# Patient Record
Sex: Male | Born: 1965 | Race: White | Hispanic: No | Marital: Single | Smoking: Former smoker
Health system: Southern US, Community
[De-identification: ages and names within clinical notes are randomized; demographics above are authoritative.]

## PROBLEM LIST (undated history)

## (undated) DIAGNOSIS — T7840XA Allergy, unspecified, initial encounter: Secondary | ICD-10-CM

## (undated) DIAGNOSIS — M4800 Spinal stenosis, site unspecified: Secondary | ICD-10-CM

## (undated) DIAGNOSIS — H269 Unspecified cataract: Secondary | ICD-10-CM

## (undated) DIAGNOSIS — M5136 Other intervertebral disc degeneration, lumbar region: Secondary | ICD-10-CM

## (undated) DIAGNOSIS — H409 Unspecified glaucoma: Secondary | ICD-10-CM

## (undated) DIAGNOSIS — F419 Anxiety disorder, unspecified: Secondary | ICD-10-CM

## (undated) HISTORY — PX: KNEE SURGERY: SHX244

## (undated) HISTORY — DX: Unspecified glaucoma: H40.9

## (undated) HISTORY — DX: Allergy, unspecified, initial encounter: T78.40XA

## (undated) HISTORY — DX: Other intervertebral disc degeneration, lumbar region: M51.36

## (undated) HISTORY — PX: EYE SURGERY: SHX253

## (undated) HISTORY — PX: SPINAL FUSION: SHX223

## (undated) HISTORY — DX: Unspecified cataract: H26.9

## (undated) HISTORY — DX: Anxiety disorder, unspecified: F41.9

## (undated) HISTORY — DX: Spinal stenosis, site unspecified: M48.00

---

## 2016-05-30 ENCOUNTER — Encounter: Payer: Self-pay | Admitting: Emergency Medicine

## 2016-05-30 ENCOUNTER — Ambulatory Visit (INDEPENDENT_AMBULATORY_CARE_PROVIDER_SITE_OTHER): Payer: Medicare HMO | Admitting: Emergency Medicine

## 2016-05-30 VITALS — BP 131/89 | HR 83 | Temp 97.6°F | Resp 18 | Ht 75.0 in | Wt 229.0 lb

## 2016-05-30 DIAGNOSIS — H401113 Primary open-angle glaucoma, right eye, severe stage: Secondary | ICD-10-CM | POA: Diagnosis not present

## 2016-05-30 DIAGNOSIS — M5136 Other intervertebral disc degeneration, lumbar region: Secondary | ICD-10-CM | POA: Diagnosis not present

## 2016-05-30 DIAGNOSIS — Z0001 Encounter for general adult medical examination with abnormal findings: Secondary | ICD-10-CM | POA: Diagnosis not present

## 2016-05-30 NOTE — Patient Instructions (Addendum)
IF you received an x-ray today, you will receive an invoice from [facility]. Please contact [facility] at [phone] with questions or concerns regarding your invoice.   IF you received labwork today, you will receive an invoice from [facility]. Please contact [facility] at [phone] with questions or concerns regarding your invoice.   Our billing staff will not be able to assist you with questions regarding bills from these companies.  You will be contacted with the lab results as soon as they are available. The fastest way to get your results is to activate your My Chart account. Instructions are located on the last page of this paperwork. If you have not Lamb from [place] regarding the results in 2 weeks, please contact this office.         Health Maintenance, Male A healthy lifestyle and preventive care is important for your health and wellness. Ask your health care provider about what schedule of regular examinations is right for you. What should I know about weight and diet?  Eat a Healthy Diet  Eat plenty of vegetables, fruits, whole grains, low-fat dairy products, and lean protein.  Do not eat a lot of foods high in solid fats, added sugars, or salt. Maintain a Healthy Weight  Regular exercise can help you achieve or maintain a healthy weight. You should:  Do at least 150 minutes of exercise each week. The exercise should increase your heart rate and make you sweat (moderate-intensity exercise).  Do strength-training exercises at least twice a week. Watch Your Levels of Cholesterol and Blood Lipids  Have your blood tested for lipids and cholesterol every 5 years starting at 53 years of age. If you are at high risk for heart disease, you should start having your blood tested when you are 53 years old. You may need to have your cholesterol levels checked more often if:  Your lipid or cholesterol levels are high.  You are older than 53 years of  age.  You are at high risk for heart disease. What should I know about cancer screening? Many types of cancers can be detected early and may often be prevented. Lung Cancer  You should be screened every year for lung cancer if:  You are a current smoker who has smoked for at least 30 years.  You are a former smoker who has quit within the past 15 years.  Talk to your health care provider about your screening options, when you should start screening, and how often you should be screened. Colorectal Cancer  Routine colorectal cancer screening usually begins at 53 years of age and should be repeated every 5-10 years until you are 53 years old. You may need to be screened more often if early forms of precancerous polyps or small growths are found. Your health care provider may recommend screening at an earlier age if you have risk factors for colon cancer.  Your health care provider may recommend using home test kits to check for hidden blood in the stool.  A small camera at the end of a tube can be used to examine your colon (sigmoidoscopy or colonoscopy). This checks for the earliest forms of colorectal cancer. Prostate and Testicular Cancer  Depending on your age and overall health, your health care provider may do certain tests to screen for prostate and testicular cancer.  Talk to your health care provider about any symptoms or concerns you have about testicular or prostate cancer. Skin Cancer  Check your skin from  head to toe regularly.  Tell your health care provider about any new moles or changes in moles, especially if:  There is a change in a mole's size, shape, or color.  You have a mole that is larger than a pencil eraser.  Always use sunscreen. Apply sunscreen liberally and repeat throughout the day.  Protect yourself by wearing long sleeves, pants, a wide-brimmed hat, and sunglasses when outside. What should I know about heart disease, diabetes, and high blood  pressure?  If you are 41-9 years of age, have your blood pressure checked every 3-5 years. If you are 50 years of age or older, have your blood pressure checked every year. You should have your blood pressure measured twice-once when you are at a hospital or clinic, and once when you are not at a hospital or clinic. Record the average of the two measurements. To check your blood pressure when you are not at a hospital or clinic, you can use:  An automated blood pressure machine at a pharmacy.  A home blood pressure monitor.  Talk to your health care provider about your target blood pressure.  If you are between 78-70 years old, ask your health care provider if you should take aspirin to prevent heart disease.  Have regular diabetes screenings by checking your fasting blood sugar level.  If you are at a normal weight and have a low risk for diabetes, have this test once every three years after the age of 3.  If you are overweight and have a high risk for diabetes, consider being tested at a younger age or more often.  A one-time screening for abdominal aortic aneurysm (AAA) by ultrasound is recommended for men aged 22-75 years who are current or former smokers. What should I know about preventing infection? Hepatitis B  If you have a higher risk for hepatitis B, you should be screened for this virus. Talk with your health care provider to find out if you are at risk for hepatitis B infection. Hepatitis C  Blood testing is recommended for:  Everyone born from [birth year] through 1965.  Anyone with known risk factors for hepatitis C. Sexually Transmitted Diseases (STDs)  You should be screened each year for STDs including gonorrhea and chlamydia if:  You are sexually active and are younger than 53 years of age.  You are older than 53 years of age and your health care provider tells you that you are at risk for this type of infection.  Your sexual activity has changed since you were last  screened and you are at an increased risk for chlamydia or gonorrhea. Ask your health care provider if you are at risk.  Talk with your health care provider about whether you are at high risk of being infected with HIV. Your health care provider may recommend a prescription medicine to help prevent HIV infection. What else can I do?  Schedule regular health, dental, and eye exams.  Stay current with your vaccines (immunizations).  Do not use any tobacco products, such as cigarettes, chewing tobacco, and e-cigarettes. If you need help quitting, ask your health care provider.  Limit alcohol intake to no more than 2 drinks per day. One drink equals 12 ounces of beer, 5 ounces of wine, or 1 ounces of hard liquor.  Do not use street drugs.  Do not share needles.  Ask your health care provider for help if you need support or information about quitting drugs.  Tell your health care provider if  you often feel depressed.  Tell your health care provider if you have ever been abused or do not feel safe at home. This information is not intended to replace advice given to you by your health care provider. Make sure you discuss any questions you have with your health care provider. Document Released: 07/12/2007 Document Revised: 09/12/2015 Document Reviewed: 10/17/2014 Elsevier Interactive Patient Education  2017 Elsevier Inc.  Back Pain, Adult Back pain is very common. The pain often gets better over time. The cause of back pain is usually not dangerous. Most people can learn to manage their back pain on their own. Follow these instructions at home: Watch your back pain for any changes. The following actions may help to lessen any pain you are feeling:  Stay active. Start with short walks on flat ground if you can. Try to walk farther each day.  Exercise regularly as told by your doctor. Exercise helps your back heal faster. It also helps avoid future injury by keeping your muscles strong and  flexible.  Do not sit, drive, or stand in one place for more than 30 minutes.  Do not stay in bed. Resting more than 1-2 days can slow down your recovery.  Be careful when you bend or lift an object. Use good form when lifting:  Bend at your knees.  Keep the object close to your body.  Do not twist.  Sleep on a firm mattress. Lie on your side, and bend your knees. If you lie on your back, put a pillow under your knees.  Take medicines only as told by your doctor.  Put ice on the injured area.  Put ice in a plastic bag.  Place a towel between your skin and the bag.  Leave the ice on for 20 minutes, 2-3 times a day for the first 2-3 days. After that, you can switch between ice and heat packs.  Avoid feeling anxious or stressed. Find good ways to deal with stress, such as exercise.  Maintain a healthy weight. Extra weight puts stress on your back. Contact a doctor if:  You have pain that does not go away with rest or medicine.  You have worsening pain that goes down into your legs or buttocks.  You have pain that does not get better in one week.  You have pain at night.  You lose weight.  You have a fever or chills. Get help right away if:  You cannot control when you poop (bowel movement) or pee (urinate).  Your arms or legs feel weak.  Your arms or legs lose feeling (numbness).  You feel sick to your stomach (nauseous) or throw up (vomit).  You have belly (abdominal) pain.  You feel like you may pass out (faint). This information is not intended to replace advice given to you by your health care provider. Make sure you discuss any questions you have with your health care provider. Document Released: 07/02/2007 Document Revised: 06/21/2015 Document Reviewed: 05/17/2013 Elsevier Interactive Patient Education  2017 [employer]

## 2016-05-30 NOTE — Progress Notes (Signed)
Matthew Weaver 51 y.o.   Chief Complaint  Patient presents with  . Establish Care    HISTORY OF PRESENT ILLNESS:  This is a 51 y.o. male recently moved from [place] and needs to establish care in this rea; main problem is chronic lumbar pain secondary to severe DDD of lumbar spine; s/p surgery that wasn't very successful; has chronic low back pain; also has chronic glaucoma right eye with about 20% vision left. Brought copies of medical records; reviewed.  HPI   Prior to Admission medications   Medication Sig Start Date End Date Taking? Authorizing Provider  brimonidine (ALPHAGAN P) 0.1 % SOLN    Yes Historical Provider, MD  citalopram (CELEXA) 20 MG tablet Take 20 mg by mouth daily.   Yes Historical Provider, MD  diclofenac (VOLTAREN) 50 MG EC tablet Take 50 mg by mouth 3 (three) times daily. As needed   Yes Historical Provider, MD  latanoprost (XALATAN) 0.005 % ophthalmic solution 1 drop at bedtime.   Yes Historical Provider, MD  timolol (BETIMOL) 0.5 % ophthalmic solution 1 drop 2 (two) times daily.   Yes Historical Provider, MD  tizanidine (ZANAFLEX) 2 MG capsule Take 2 mg by mouth 3 (three) times daily as needed for muscle spasms.   Yes Historical Provider, MD    No Known Allergies  There are no active problems to display for this patient.   Past Medical History:  Diagnosis Date  . Allergy   . Anxiety   . Cataract   . DDD (degenerative disc disease), lumbar   . Glaucoma   . Spinal stenosis     Past Surgical History:  Procedure Laterality Date  . EYE SURGERY    . KNEE SURGERY    . SPINAL FUSION      Social History   Social History  . Marital status: Single    Spouse name: N/A  . Number of children: N/A  . Years of education: N/A   Occupational History  . Not on file.   Social History Main Topics  . Smoking status: Former Smoker    Quit date: 05/30/1996  . Smokeless tobacco: Never Used  . Alcohol use No  . Drug use: No  . Sexual activity: Not on file    Other Topics Concern  . Not on file   Social History Narrative  . No narrative on file    Family History  Problem Relation Age of Onset  . Cancer Mother   . Diabetes Father   . Hyperlipidemia Father   . Hypertension Father   . Stroke Father      Review of Systems  Constitutional: Negative.  Negative for chills, fever and weight loss.  HENT: Negative.  Negative for ear pain, hearing loss and sore throat.   Eyes: Positive for blurred vision (right eye). Negative for pain.  Respiratory: Negative.  Negative for cough and shortness of breath.   Cardiovascular: Negative.  Negative for chest pain, palpitations and leg swelling.  Gastrointestinal: Negative for abdominal pain, diarrhea, nausea and vomiting.  Genitourinary: Negative for dysuria and hematuria.  Musculoskeletal: Positive for back pain. Negative for neck pain.  Skin: Negative.  Negative for rash.  Neurological: Negative.  Negative for dizziness, sensory change, speech change, focal weakness and headaches.  Endo/Heme/Allergies: Negative.   All other systems reviewed and are negative.  Vitals:   05/30/16 0952  BP: 131/89  Pulse: 83  Resp: 18  Temp: 97.6 F (36.4 C)     Physical Exam  Constitutional: He  is oriented to person, place, and time. He appears well-developed and well-nourished.  HENT:  Head: Normocephalic and atraumatic.  Nose: Nose normal.  Mouth/Throat: Oropharynx is clear and moist.  Eyes: Conjunctivae and EOM are normal.  Right pupil bigger than left and poorly reactive with irregular shape  Neck: Normal range of motion. Neck supple. No JVD present.  Cardiovascular: Normal rate, regular rhythm and normal heart sounds.   Pulmonary/Chest: Effort normal and breath sounds normal.  Abdominal: Soft. Bowel sounds are normal. He exhibits no distension. There is no tenderness.  Musculoskeletal:       Lumbar back: He exhibits decreased range of motion, tenderness and pain. He exhibits no bony tenderness,  no swelling and normal pulse.  Lymphadenopathy:    He has no cervical adenopathy.  Neurological: He is alert and oriented to person, place, and time. He displays normal reflexes. No sensory deficit. He exhibits normal muscle tone.  Skin: Skin is warm and dry. Capillary refill takes less than 2 seconds.  Psychiatric: He has a normal mood and affect.  Vitals reviewed.    ASSESSMENT & PLAN: Matthew Weaver was seen today for establish care.  Diagnoses and all orders for this visit:  Encounter for general adult medical examination with abnormal findings -     CBC with Differential -     Comprehensive metabolic panel -     Hemoglobin A1c -     Lipid panel -     Hepatitis C antibody screen -     HIV antibody -     Ambulatory referral to Orthopedic Surgery -     Ambulatory referral to Pain Clinic  DDD (degenerative disc disease), lumbar  Primary open angle glaucoma (POAG) of right eye, severe stage -     Ambulatory referral to Ophthalmology    Patient Instructions       IF you received an x-ray today, you will receive an invoice from [facility]. Please contact [facility] at [phone] with questions or concerns regarding your invoice.   IF you received labwork today, you will receive an invoice from [facility]. Please contact [facility] at [phone] with questions or concerns regarding your invoice.   Our billing staff will not be able to assist you with questions regarding bills from these companies.  You will be contacted with the lab results as soon as they are available. The fastest way to get your results is to activate your My Chart account. Instructions are located on the last page of this paperwork. If you have not heard from [place] regarding the results in 2 weeks, please contact this office.         Health Maintenance, Male A healthy lifestyle and preventive care is important for your health and wellness. Ask your health care provider about what schedule  of regular examinations is right for you. What should I know about weight and diet?  Eat a Healthy Diet  Eat plenty of vegetables, fruits, whole grains, low-fat dairy products, and lean protein.  Do not eat a lot of foods high in solid fats, added sugars, or salt. Maintain a Healthy Weight  Regular exercise can help you achieve or maintain a healthy weight. You should:  Do at least 150 minutes of exercise each week. The exercise should increase your heart rate and make you sweat (moderate-intensity exercise).  Do strength-training exercises at least twice a week. Watch Your Levels of Cholesterol and Blood Lipids  Have your blood tested for lipids and cholesterol every 5 years starting  at 53 years of age. If you are at high risk for heart disease, you should start having your blood tested when you are 53 years old. You may need to have your cholesterol levels checked more often if:  Your lipid or cholesterol levels are high.  You are older than 53 years of age.  You are at high risk for heart disease. What should I know about cancer screening? Many types of cancers can be detected early and may often be prevented. Lung Cancer  You should be screened every year for lung cancer if:  You are a current smoker who has smoked for at least 30 years.  You are a former smoker who has quit within the past 15 years.  Talk to your health care provider about your screening options, when you should start screening, and how often you should be screened. Colorectal Cancer  Routine colorectal cancer screening usually begins at 53 years of age and should be repeated every 5-10 years until you are 53 years old. You may need to be screened more often if early forms of precancerous polyps or small growths are found. Your health care provider may recommend screening at an earlier age if you have risk factors for colon cancer.  Your health care provider may recommend using home test kits to check for  hidden blood in the stool.  A small camera at the end of a tube can be used to examine your colon (sigmoidoscopy or colonoscopy). This checks for the earliest forms of colorectal cancer. Prostate and Testicular Cancer  Depending on your age and overall health, your health care provider may do certain tests to screen for prostate and testicular cancer.  Talk to your health care provider about any symptoms or concerns you have about testicular or prostate cancer. Skin Cancer  Check your skin from head to toe regularly.  Tell your health care provider about any new moles or changes in moles, especially if:  There is a change in a mole's size, shape, or color.  You have a mole that is larger than a pencil eraser.  Always use sunscreen. Apply sunscreen liberally and repeat throughout the day.  Protect yourself by wearing long sleeves, pants, a wide-brimmed hat, and sunglasses when outside. What should I know about heart disease, diabetes, and high blood pressure?  If you are 33-78 years of age, have your blood pressure checked every 3-5 years. If you are 44 years of age or older, have your blood pressure checked every year. You should have your blood pressure measured twice-once when you are at a hospital or clinic, and once when you are not at a hospital or clinic. Record the average of the two measurements. To check your blood pressure when you are not at a hospital or clinic, you can use:  An automated blood pressure machine at a pharmacy.  A home blood pressure monitor.  Talk to your health care provider about your target blood pressure.  If you are between 66-62 years old, ask your health care provider if you should take aspirin to prevent heart disease.  Have regular diabetes screenings by checking your fasting blood sugar level.  If you are at a normal weight and have a low risk for diabetes, have this test once every three years after the age of 101.  If you are overweight and  have a high risk for diabetes, consider being tested at a younger age or more often.  A one-time screening for abdominal aortic  aneurysm (AAA) by ultrasound is recommended for men aged 65-75 years who are current or former smokers. What should I know about preventing infection? Hepatitis B  If you have a higher risk for hepatitis B, you should be screened for this virus. Talk with your health care provider to find out if you are at risk for hepatitis B infection. Hepatitis C  Blood testing is recommended for:  Everyone born from [birth year] through 1967.  Anyone with known risk factors for hepatitis C. Sexually Transmitted Diseases (STDs)  You should be screened each year for STDs including gonorrhea and chlamydia if:  You are sexually active and are younger than 53 years of age.  You are older than 53 years of age and your health care provider tells you that you are at risk for this type of infection.  Your sexual activity has changed since you were last screened and you are at an increased risk for chlamydia or gonorrhea. Ask your health care provider if you are at risk.  Talk with your health care provider about whether you are at high risk of being infected with HIV. Your health care provider may recommend a prescription medicine to help prevent HIV infection. What else can I do?  Schedule regular health, dental, and eye exams.  Stay current with your vaccines (immunizations).  Do not use any tobacco products, such as cigarettes, chewing tobacco, and e-cigarettes. If you need help quitting, ask your health care provider.  Limit alcohol intake to no more than 2 drinks per day. One drink equals 12 ounces of beer, 5 ounces of wine, or 1 ounces of hard liquor.  Do not use street drugs.  Do not share needles.  Ask your health care provider for help if you need support or information about quitting drugs.  Tell your health care provider if you often feel depressed.  Tell your health  care provider if you have ever been abused or do not feel safe at home. This information is not intended to replace advice given to you by your health care provider. Make sure you discuss any questions you have with your health care provider. Document Released: 07/12/2007 Document Revised: 09/12/2015 Document Reviewed: 10/17/2014 Elsevier Interactive Patient Education  2017 Elsevier Inc.  Back Pain, Adult Back pain is very common. The pain often gets better over time. The cause of back pain is usually not dangerous. Most people can learn to manage their back pain on their own. Follow these instructions at home: Watch your back pain for any changes. The following actions may help to lessen any pain you are feeling:  Stay active. Start with short walks on flat ground if you can. Try to walk farther each day.  Exercise regularly as told by your doctor. Exercise helps your back heal faster. It also helps avoid future injury by keeping your muscles strong and flexible.  Do not sit, drive, or stand in one place for more than 30 minutes.  Do not stay in bed. Resting more than 1-2 days can slow down your recovery.  Be careful when you bend or lift an object. Use good form when lifting:  Bend at your knees.  Keep the object close to your body.  Do not twist.  Sleep on a firm mattress. Lie on your side, and bend your knees. If you lie on your back, put a pillow under your knees.  Take medicines only as told by your doctor.  Put ice on the injured area.  Put ice  in a plastic bag.  Place a towel between your skin and the bag.  Leave the ice on for 20 minutes, 2-3 times a day for the first 2-3 days. After that, you can switch between ice and heat packs.  Avoid feeling anxious or stressed. Find good ways to deal with stress, such as exercise.  Maintain a healthy weight. Extra weight puts stress on your back. Contact a doctor if:  You have pain that does not go away with rest or  medicine.  You have worsening pain that goes down into your legs or buttocks.  You have pain that does not get better in one week.  You have pain at night.  You lose weight.  You have a fever or chills. Get help right away if:  You cannot control when you poop (bowel movement) or pee (urinate).  Your arms or legs feel weak.  Your arms or legs lose feeling (numbness).  You feel sick to your stomach (nauseous) or throw up (vomit).  You have belly (abdominal) pain.  You feel like you may pass out (faint). This information is not intended to replace advice given to you by your health care provider. Make sure you discuss any questions you have with your health care provider. Document Released: 07/02/2007 Document Revised: 06/21/2015 Document Reviewed: 05/17/2013 Elsevier Interactive Patient Education  2017 Elsevier Inc.      Edwina BarthMiguel Jett Kulzer, MD [facility]

## 2016-05-31 LAB — COMPREHENSIVE METABOLIC PANEL
ALK PHOS: 52 IU/L (ref 39–117)
ALT: 13 IU/L (ref 0–44)
AST: 19 IU/L (ref 0–40)
Albumin/Globulin Ratio: 1.8 (ref 1.2–2.2)
Albumin: 4.4 g/dL (ref 3.5–5.5)
BILIRUBIN TOTAL: 0.5 mg/dL (ref 0.0–1.2)
BUN/Creatinine Ratio: 15 (ref 9–20)
BUN: 15 mg/dL (ref 6–24)
CHLORIDE: 102 mmol/L (ref 96–106)
CO2: 22 mmol/L (ref 18–29)
Calcium: 9.3 mg/dL (ref 8.7–10.2)
Creatinine, Ser: 1.01 mg/dL (ref 0.76–1.27)
GFR calc Af Amer: 98 mL/min/{1.73_m2} (ref >59)
GFR calc non Af Amer: 85 mL/min/{1.73_m2} (ref >59)
GLUCOSE: 104 mg/dL — AB (ref 65–99)
Globulin, Total: 2.4 g/dL (ref 1.5–4.5)
Potassium: 4.4 mmol/L (ref 3.5–5.2)
Sodium: 140 mmol/L (ref 134–144)
TOTAL PROTEIN: 6.8 g/dL (ref 6.0–8.5)

## 2016-05-31 LAB — CBC WITH DIFFERENTIAL/PLATELET
Basophils Absolute: 0.1 10*3/uL (ref 0.0–0.2)
Basos: 1 %
EOS (ABSOLUTE): 0.2 10*3/uL (ref 0.0–0.4)
Eos: 4 %
Hematocrit: 43.6 % (ref 37.5–51.0)
Hemoglobin: 14.8 g/dL (ref 13.0–17.7)
IMMATURE GRANS (ABS): 0 10*3/uL (ref 0.0–0.1)
Immature Granulocytes: 0 %
LYMPHS: 29 %
Lymphocytes Absolute: 1.8 10*3/uL (ref 0.7–3.1)
MCH: 30.6 pg (ref 26.6–33.0)
MCHC: 33.9 g/dL (ref 31.5–35.7)
MCV: 90 fL (ref 79–97)
Monocytes Absolute: 0.7 10*3/uL (ref 0.1–0.9)
Monocytes: 11 %
NEUTROS ABS: 3.3 10*3/uL (ref 1.4–7.0)
Neutrophils: 55 %
PLATELETS: 276 10*3/uL (ref 150–379)
RBC: 4.83 x10E6/uL (ref 4.14–5.80)
RDW: 13.6 % (ref 12.3–15.4)
WBC: 6 10*3/uL (ref 3.4–10.8)

## 2016-05-31 LAB — HEMOGLOBIN A1C
ESTIMATED AVERAGE GLUCOSE: 108 mg/dL
HEMOGLOBIN A1C: 5.4 % (ref 4.8–5.6)

## 2016-05-31 LAB — LIPID PANEL
CHOLESTEROL TOTAL: 188 mg/dL (ref 100–199)
Chol/HDL Ratio: 3.6 ratio (ref 0.0–5.0)
HDL: 52 mg/dL (ref >39)
LDL Calculated: 120 mg/dL — ABNORMAL HIGH (ref 0–99)
TRIGLYCERIDES: 80 mg/dL (ref 0–149)
VLDL Cholesterol Cal: 16 mg/dL (ref 5–40)

## 2016-05-31 LAB — HEPATITIS C ANTIBODY: Hep C Virus Ab: <0.1 s/co ratio (ref 0.0–0.9)

## 2016-05-31 LAB — HIV ANTIBODY (ROUTINE TESTING W REFLEX): HIV Screen 4th Generation wRfx: NONREACTIVE

## 2016-06-11 DIAGNOSIS — H35371 Puckering of macula, right eye: Secondary | ICD-10-CM | POA: Diagnosis not present

## 2016-06-11 DIAGNOSIS — H25812 Combined forms of age-related cataract, left eye: Secondary | ICD-10-CM | POA: Diagnosis not present

## 2016-06-11 DIAGNOSIS — H21232 Degeneration of iris (pigmentary), left eye: Secondary | ICD-10-CM | POA: Diagnosis not present

## 2016-06-11 DIAGNOSIS — H401313 Pigmentary glaucoma, right eye, severe stage: Secondary | ICD-10-CM | POA: Diagnosis not present

## 2016-08-13 ENCOUNTER — Encounter (INDEPENDENT_AMBULATORY_CARE_PROVIDER_SITE_OTHER): Payer: Self-pay | Admitting: Specialist

## 2016-08-13 ENCOUNTER — Ambulatory Visit (INDEPENDENT_AMBULATORY_CARE_PROVIDER_SITE_OTHER): Payer: Medicare HMO

## 2016-08-13 ENCOUNTER — Ambulatory Visit (INDEPENDENT_AMBULATORY_CARE_PROVIDER_SITE_OTHER): Payer: Medicare HMO | Admitting: Specialist

## 2016-08-13 VITALS — BP 124/90 | HR 66 | Ht 75.0 in | Wt 229.0 lb

## 2016-08-13 DIAGNOSIS — M4726 Other spondylosis with radiculopathy, lumbar region: Secondary | ICD-10-CM

## 2016-08-13 DIAGNOSIS — M5442 Lumbago with sciatica, left side: Secondary | ICD-10-CM

## 2016-08-13 DIAGNOSIS — R29898 Other symptoms and signs involving the musculoskeletal system: Secondary | ICD-10-CM

## 2016-08-13 DIAGNOSIS — M5441 Lumbago with sciatica, right side: Secondary | ICD-10-CM

## 2016-08-13 DIAGNOSIS — M5136 Other intervertebral disc degeneration, lumbar region: Secondary | ICD-10-CM

## 2016-08-13 DIAGNOSIS — G8929 Other chronic pain: Secondary | ICD-10-CM | POA: Diagnosis not present

## 2016-08-13 DIAGNOSIS — S82142S Displaced bicondylar fracture of left tibia, sequela: Secondary | ICD-10-CM

## 2016-08-13 DIAGNOSIS — M1612 Unilateral primary osteoarthritis, left hip: Secondary | ICD-10-CM

## 2016-08-13 DIAGNOSIS — R200 Anesthesia of skin: Secondary | ICD-10-CM | POA: Diagnosis not present

## 2016-08-13 MED ORDER — DICLOFENAC SODIUM 50 MG PO TBEC: 50.0000 mg | DELAYED_RELEASE_TABLET | Freq: Three times a day (TID) | ORAL | 3 refills | Status: AC

## 2016-08-13 MED ORDER — TIZANIDINE HCL 2 MG PO CAPS: 2.0000 mg | ORAL_CAPSULE | Freq: Three times a day (TID) | ORAL | 2 refills | Status: AC | PRN

## 2016-08-13 NOTE — Progress Notes (Signed)
Office Visit Note   Patient: Matthew Weaver           Date of Birth: 10-09-1965           MRN: [id number] Visit Date: 08/13/2016              Requested by: Georgina QuintSagardia, Miguel Jose, MD [address] [place], [place] [address] PCP: Georgina QuintSagardia, Miguel Jose, MD   Assessment & Plan: Visit Diagnoses:  1. Chronic bilateral low back pain with bilateral sciatica   2. DDD (degenerative disc disease), lumbar   3. Bilateral leg weakness   4. Bilateral leg numbness   5. Other spondylosis with radiculopathy, lumbar region   6. Tibial plateau fracture, left, sequela   7. Unilateral primary osteoarthritis, left hip   51 year old male with a long history of lumbar conditions that have disabled him in the past and present. Difficulty with standing and walking and Sitting. Pain on a scale of 10 at "9" even during today's exam, but only taking intermittant tizanidine and diclofenac. Exam suggests tight hamstrings, limited ROM of the lumbar spine with weakness in foot dorsiflexion bilateral inspite of relatively normal or mildly hyper reflexes and symmetric thigh and calf circumfences suggesting no atrophy. Previous history of peripheral neuropathy with a history of previous [place] in [place]. He was Referred by Dr. Alvy BimlerSagardia for a orthopaedic evaluation, no recent MRI since 03/2014. He complains of increasing leg and foot weakness and this should be investigated with repeat EMG/NCV and Lumbar MRI. Increased reflexes and xray findings at the thoracolumbar junction may eventually require assessment of the thoracic spine, but no significant spasticity or clonus is present so that lumbar eval is appropriate at this time and referral to  Neurology to help us sort out the seemingly diffuse numbness and weakness and whether there is an underlying peripheral neuropathy.Base on his current loss of motion of the lumbar spine and lower extremity weakness I believe he is totally disabled and incapable of performing  gainful employment.  Plan: Avoid frequent bending and stooping  No lifting greater than 10 lbs. May use ice or moist heat for pain. Weight loss is of benefit. Handicap license is approved. MRI of the lumbar spine is ordered to be done with and without contrast Placed an order for a neurology eval with EMG/NCV to assess lower leg nerve findings.  Follow-Up Instructions: Return in about 5 weeks (around 09/17/2016), or if symptoms worsen or fail to improve.   Orders:  Orders Placed This Encounter  Procedures  . XR Lumbar Spine 2-3 Views  . MR Lumbar Spine W Wo Contrast  . Ambulatory referral to Neurology   Meds ordered this encounter  Medications  . tizanidine (ZANAFLEX) 2 MG capsule    Sig: Take 1 capsule (2 mg total) by mouth 3 (three) times daily as needed for muscle spasms.    Dispense:  30 capsule    Refill:  2  . diclofenac (VOLTAREN) 50 MG EC tablet    Sig: Take 1 tablet (50 mg total) by mouth 3 (three) times daily. As needed    Dispense:  90 tablet    Refill:  3      Procedures: No procedures performed   Clinical Data: No additional findings.   Subjective: Chief Complaint  Patient presents with  . Lower Back - Numbness, Pain    51 year old male with long history of low back pain. Work[place] in [employer] [place] in the town  of [place], [place] in [place]. He reportedly injured his back after working 10 years, he underwent lumbar fusion L3-4 1993. 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 He had injuryu to the left leg with a tibial plateau fracture. He has numbness in the buttocks the thighs and the the calfs. The right side with numbness in the toes All of the toes. The little toe is really numb. The numbness is becoming worse and it is becoming consistent. He has night numbness in the legs and it awakens him. He takes tizanide that he uses as needed for restlessness in his  legs with trying to sleep. He is able to walk just a short distance. He gets help with grocery shopping, he leans on the shopping cart.Sitting is also painful, he gets numbness in his bottom. His lower back has a squeezing pain. He gets aches and stiffness. Lying flat on his back the legs get numbness. He feels like the L5-S1 level is the cause of the numbness. Scale of 1-10 the pain in the back is about a "9" presently.  Bending and stooping increases the pain. Riding in a car the legs go numb after 15-20 minutes. Bowel and bladder is functioning normally.The pain is in the legs and the back. Coughing or sneezing do cause pain sometimes. Legs do feel weak at time. Dr. Jaci Standard, [facility] did EMGs/NCV and he also had these done in [place] in the past 5 years. Last MRI was in 03/2014     Review of Systems  Constitutional: Positive for activity change and unexpected weight change.  HENT: Positive for postnasal drip and sneezing.   Eyes: Negative.   Respiratory: Positive for chest tightness. Negative for shortness of breath and wheezing.   Cardiovascular: Negative.  Negative for leg swelling.  Gastrointestinal: Negative.  Negative for abdominal pain, diarrhea, nausea and rectal pain.  Endocrine: Negative.   Genitourinary: Negative.  Negative for dysuria, enuresis and flank pain.  Musculoskeletal: Positive for arthralgias, back pain and joint swelling. Negative for neck pain and neck stiffness.  Skin: Negative.  Negative for color change, pallor, rash and wound.  Allergic/Immunologic: Negative.  Negative for environmental allergies and immunocompromised state.  Neurological: Positive for light-headedness and numbness. Negative for dizziness, tremors, seizures, syncope, facial asymmetry, speech difficulty, weakness and headaches.  Hematological: Negative.  Negative for adenopathy. Does not bruise/bleed easily.  Psychiatric/Behavioral: Negative for agitation, behavioral problems, confusion, dysphoric  mood, self-injury, sleep disturbance and suicidal ideas. The patient is nervous/anxious. The patient is not hyperactive.      Objective: Vital Signs: BP 124/90 (BP Location: Left Arm, Patient Position: Sitting)   Pulse 66   Ht 6\' 3"  (1.905 m)   Wt 229 lb (103.9 kg)   BMI 28.62 kg/m   Physical Exam  Constitutional: He is oriented to person, place, and time. He appears well-developed and well-nourished.  HENT:  Head: Normocephalic and atraumatic.  Eyes: Pupils are equal, round, and reactive to light. EOM are normal.  Neck: Normal range of motion. Neck supple.  Pulmonary/Chest: Effort normal and breath sounds normal.  Abdominal: Soft. Bowel sounds are normal.  Musculoskeletal:       Left knee: He exhibits no effusion.  Neurological: He is alert and oriented to person, place, and time.  Skin: Skin is warm and dry.  Psychiatric: He has a normal mood and affect. His behavior is normal. Judgment and  thought content normal.    Left Knee Exam   Tenderness  The patient is experiencing no tenderness.     Range of Motion  Extension: normal  Flexion: 130   Muscle Strength   The patient has normal left knee strength.  Tests  McMurray:  Medial - negative Lateral - negative Lachman:  Anterior - negative    Posterior - negative Drawer:       Anterior - negative     Posterior - negative Varus: negative Valgus: negative Pivot Shift: negative Patellar Apprehension: negative  Other  Erythema: absent Scars: present Sensation: normal Pulse: present Swelling: none Effusion: no effusion present  Comments:  Pushes off chair to stand upright, using a cane in the left hand.    Right Hip Exam  Right hip exam is normal.   Tenderness  The patient is experiencing no tenderness.     Range of Motion  Extension: normal  Flexion: 110  Internal Rotation: 15  External Rotation: 40  Abduction: normal  Adduction: normal   Muscle Strength  Abduction: 5/5  Adduction: 5/5    Flexion: 5/5   Tests  FABER: negative Ober: negative  Other  Erythema: absent Scars: absent Sensation: normal Pulse: present   Left Hip Exam   Range of Motion  Extension: normal  Flexion:  100 abnormal  Internal Rotation: 10  External Rotation: 20  Abduction: normal  Adduction: normal   Muscle Strength  Abduction: 5/5  Adduction: 5/5  Flexion: 5/5   Tests  FABER: positive Ober: positive  Other  Erythema: absent Scars: absent Sensation: normal   Back Exam   Tenderness  The patient is experiencing tenderness in the lumbar.  Range of Motion  Extension:  30 abnormal  Flexion:  30 abnormal  Lateral Bend Right:  30 abnormal  Lateral Bend Left:  50 normal  Rotation Right:  60 normal  Rotation Left:  60 normal   Muscle Strength  Right Quadriceps:  5/5  Left Quadriceps:  5/5  Right Hamstrings:  5/5  Left Hamstrings:  5/5   Tests  Straight leg raise right: negative Straight leg raise left: negative  Reflexes  Patellar: 3/4 Achilles: 2/4 Babinski's sign: normal   Other  Toe Walk: abnormal Heel Walk: abnormal Sensation: normal Gait: normal  Erythema: no back redness  Comments:  Calf circumference is 15 inches bilat. Thigh circumference is 17.5 bilat. Weak in pulling his feet up, dorsiflexion weakness is right 3/5, left is 4/5. Numbness right foot plantar aspect diffuse into all the toes of the right foot.  Tight hamstrings with SLR A generous well healed thoracolumbar incision extends from T11 to S1      Specialty Comments:  No specialty comments available.  Imaging: Xr Lumbar Spine 2-3 Views  Result Date: 08/13/2016 Ap and lateral flexion and extension views of the lumbar spine show bilateral translaminar facet screw fixation L3-4 with posterolateral fusion masses. There is degenerative disc narrowing at L2-3, L3-4 and L5-S1 with mild loss of disc height at each level, no fracture dislocation or subluxation. Degenerative disc narrowing  with endplate sclerosis is present at the thoracolumbar junction most severe at T11-12 and T12-L1. Some physiologic wedging of the L1 and T12 vertebral bodies. Loss of the normal lordosis at the upper lumbar segments and increase in kyphosis at the thoracolumbar junction. Spondylosis of the facets at L4-5 and L5-S1 with facet joint hypertrophy. Evidence of right L4-5 laminotomy and bilateral laminotomy at L3-4    PMFS History: There are no active  problems to display for this patient.  Past Medical History:  Diagnosis Date  . Allergy   . Anxiety   . Cataract   . DDD (degenerative disc disease), lumbar   . Glaucoma   . Spinal stenosis     Family History  Problem Relation Age of Onset  . Cancer Mother   . Diabetes Father   . Hyperlipidemia Father   . Hypertension Father   . Stroke Father     Past Surgical History:  Procedure Laterality Date  . EYE SURGERY    . KNEE SURGERY    . SPINAL FUSION     Social History   Occupational History  . Not on file.   Social History Main Topics  . Smoking status: Former Smoker    Quit date: 05/30/1996  . Smokeless tobacco: Never Used  . Alcohol use No  . Drug use: No  . Sexual activity: Not on file

## 2016-08-13 NOTE — Patient Instructions (Signed)
Avoid frequent bending and stooping  No lifting greater than 10 lbs. May use ice or moist heat for pain. Weight loss is of benefit. Handicap license is approved. MRI of the lumbar spine is ordered to be done with and without contrast Placed an order for a neurology eval with EMG/NCV to assess lower leg nerve findings.

## 2016-08-28 ENCOUNTER — Ambulatory Visit (INDEPENDENT_AMBULATORY_CARE_PROVIDER_SITE_OTHER): Payer: Self-pay | Admitting: Neurology

## 2016-08-28 ENCOUNTER — Ambulatory Visit (INDEPENDENT_AMBULATORY_CARE_PROVIDER_SITE_OTHER): Payer: Medicare HMO | Admitting: Neurology

## 2016-08-28 DIAGNOSIS — M5416 Radiculopathy, lumbar region: Secondary | ICD-10-CM

## 2016-08-28 DIAGNOSIS — Z0289 Encounter for other administrative examinations: Secondary | ICD-10-CM

## 2016-08-28 NOTE — Progress Notes (Addendum)
Full Name: Matthew Weaver Gender: Male MRN #: [id number] Date of Birth: 2065-09-05    Visit Date: 08/28/16 11:22 Age: 51 Years 6011 Months Old Examining Physician: Naomie DeanAntonia Ivy Meriwether, MD  Referring physician: [name]  History: Patient is here for evaluation of low back pain and radiculopathy. He has had multiple lumbar surgeries. He gets numbness in the toes mostly on the right. He has low back pain which is chronic. Numbness in the right ball of the foot.   Summary:  The left tibial F-wave showed delayed latency (59.1 ms, N< 56). The right tibial F-wave showed delayed latency (57.1 ms, N< 56).  All remaining nerves (as detailed in the following tables) were within normal limits. All muscles (as detailed in the following tables) were within normal limits.    Conclusion: Delayed late responses (bilateral F waves) can be seen in L5-S1 radiculopathy or early/mild polyneuropathy. Lumbar paraspinal muscles were not sampled on EMG studies because of previous surgeries but there was no acute/ongoing denervation to suggest a current radiculopathy.   Cc: Dr. Beatriz StallionNitka  [name] M.D.  [facility] [address] [place], [place] [address] Tel: [phone] Fax: [phone]        [facility]    Nerve / Sites Muscle Latency Ref. Amplitude Ref. Rel Amp Segments Distance Velocity Ref. Area    ms ms mV mV %  cm m/s m/s mVms  L Peroneal - EDB     Ankle EDB 5.3 ?6.5 2.6 ?2.0 100 Ankle - EDB 9   9.1     Fib head EDB 13.6  1.8  68 Fib head - Ankle 35 42 ?44 5.8     Pop fossa EDB 15.9  2.1  117 Pop fossa - Fib head 10 44 ?44 8.2         Pop fossa - Ankle      R Peroneal - EDB     Ankle EDB 5.9 ?6.5 2.6 ?2.0 100 Ankle - EDB 9   8.2     Fib head EDB 14.8  2.3  87.1 Fib head - Ankle 35 39 ?44 7.0     Pop fossa EDB 17.2  2.2  97.3 Pop fossa - Fib head 10 42 ?44 6.9         Pop fossa - Ankle      L Tibial - AH     Ankle AH 5.2 ?5.8 6.6 ?4.0 100 Ankle - AH 9   15.8     Pop fossa AH 16.0  4.0   60.8 Pop fossa - Ankle 45 42 ?41 13.3  R Tibial - AH     Ankle AH 4.8 ?5.8 8.6 ?4.0 100 Ankle - AH 9   18.1     Pop fossa AH 15.5  7.7  89.4 Pop fossa - Ankle 45 42 ?41 16.9             SNC    Nerve / Sites Rec. Site Peak Lat Ref.  Amp Ref. Segments Distance    ms ms V V  cm  L Sural - Ankle (Calf)     Calf Ankle 4.1 ?4.4 9 ?6 Calf - Ankle 14  R Sural - Ankle (Calf)     Calf Ankle 4.4 ?4.4 8 ?6 Calf - Ankle 14  L Superficial peroneal - Ankle     Lat leg Ankle 4.2 ?4.4 6 ?6 Lat leg - Ankle 14  R Superficial peroneal - Ankle  Lat leg Ankle 4.3 ?4.4 6 ?6 Lat leg - Ankle 14             F  Wave    Nerve F Lat Ref.   ms ms  L Tibial - AH 59.1 ?56.0  R Tibial - AH 57.1 ?56.0         H Reflex    Nerve H Lat Lat Hmax   ms ms   Left Right Ref. Left Right Ref.  Tibial - Soleus 34.2 34.2 ?35.0 35.4 34.8 ?35.0         EMG full       EMG Summary Table    Spontaneous MUAP Recruitment  Muscle IA Fib PSW Fasc Other Amp Dur. Poly Pattern  L. Vastus medialis Normal None None None _______ Normal Normal Normal Normal  R. Vastus medialis Normal None None None _______ Normal Normal Normal Normal  L. Tibialis anterior Normal None None None _______ Normal Normal Normal Normal  R. Tibialis anterior Normal None None None _______ Normal Normal Normal Normal  L. Gastrocnemius (Medial head) Normal None None None _______ Normal Normal Normal Normal  R. Gastrocnemius (Medial head) Normal None None None _______ Normal Normal Normal Normal  L. Biceps femoris (long head) Normal None None None _______ Normal Normal Normal Normal  R. Biceps femoris (long head) Normal None None None _______ Normal Normal Normal Normal  L. Gluteus maximus Normal None None None _______ Normal Normal Normal Normal  R. Gluteus maximus Normal None None None _______ Normal Normal Normal Normal  L. Gluteus medius Normal None None None _______ Normal Normal Normal Normal  R. Gluteus medius Normal None None None _______  Normal Normal Normal Normal  R. Iliopsoas Normal None None None _______ Normal Normal Normal Normal

## 2016-08-28 NOTE — Progress Notes (Signed)
See procedure note.

## 2016-09-01 ENCOUNTER — Ambulatory Visit: Payer: Medicare HMO | Admitting: Physical Medicine & Rehabilitation

## 2016-09-04 NOTE — Addendum Note (Signed)
Addended by: Naomie DeanAHERN, ANTONIA B on: 09/04/2016 06:24 PM   Modules accepted: Orders

## 2016-09-04 NOTE — Procedures (Signed)
Full Name: Matthew CrazeJohn Weaver Gender: Male MRN #: [id number] Date of Birth: Apr 09, 2065    Visit Date: 08/28/16 11:22 Age: 51 Years 0 Months Old Examining Physician: Naomie DeanAntonia Ahern, MD  Referring physician: [name]  History: Patient is here for evaluation of low back pain and radiculopathy. He has had multiple lumbar surgeries. He gets numbness in the toes mostly on the right. He has low back pain which is chronic. Numbness in the right ball of the foot.   Summary:  The left tibial F-wave showed delayed latency (59.1 ms, N< 56). The right tibial F-wave showed delayed latency (57.1 ms, N< 56).  All remaining nerves (as detailed in the following tables) were within normal limits. All muscles (as detailed in the following tables) were within normal limits.    Conclusion: Delayed late responses (bilateral F waves) can be seen in L5-S1 radiculopathy or early/mild polyneuropathy. Lumbar paraspinal muscles were not sampled on EMG studies because of previous surgeries but there was no acute/ongoing denervation to suggest a current radiculopathy.   Cc: Dr. Beatriz StallionNitka  Antonia Ahern M.D.  [facility] [address] [place], [place] [address] Tel: [phone] Fax: [phone]        [facility]    Nerve / Sites Muscle Latency Ref. Amplitude Ref. Rel Amp Segments Distance Velocity Ref. Area    ms ms mV mV %  cm m/s m/s mVms  L Peroneal - EDB     Ankle EDB 5.3 ?6.5 2.6 ?2.0 100 Ankle - EDB 9   9.1     Fib head EDB 13.6  1.8  68 Fib head - Ankle 35 42 ?44 5.8     Pop fossa EDB 15.9  2.1  117 Pop fossa - Fib head 10 44 ?44 8.2         Pop fossa - Ankle      R Peroneal - EDB     Ankle EDB 5.9 ?6.5 2.6 ?2.0 100 Ankle - EDB 9   8.2     Fib head EDB 14.8  2.3  87.1 Fib head - Ankle 35 39 ?44 7.0     Pop fossa EDB 17.2  2.2  97.3 Pop fossa - Fib head 10 42 ?44 6.9         Pop fossa - Ankle      L Tibial - AH     Ankle AH 5.2 ?5.8 6.6 ?4.0 100 Ankle - AH 9   15.8     Pop fossa AH 16.0  4.0   60.8 Pop fossa - Ankle 45 42 ?41 13.3  R Tibial - AH     Ankle AH 4.8 ?5.8 8.6 ?4.0 100 Ankle - AH 9   18.1     Pop fossa AH 15.5  7.7  89.4 Pop fossa - Ankle 45 42 ?41 16.9             SNC    Nerve / Sites Rec. Site Peak Lat Ref.  Amp Ref. Segments Distance    ms ms V V  cm  L Sural - Ankle (Calf)     Calf Ankle 4.1 ?4.4 9 ?6 Calf - Ankle 14  R Sural - Ankle (Calf)     Calf Ankle 4.4 ?4.4 8 ?6 Calf - Ankle 14  L Superficial peroneal - Ankle     Lat leg Ankle 4.2 ?4.4 6 ?6 Lat leg - Ankle 14  R Superficial peroneal - Ankle  Lat leg Ankle 4.3 ?4.4 6 ?6 Lat leg - Ankle 14             F  Wave    Nerve F Lat Ref.   ms ms  L Tibial - AH 59.1 ?56.0  R Tibial - AH 57.1 ?56.0         H Reflex    Nerve H Lat Lat Hmax   ms ms   Left Right Ref. Left Right Ref.  Tibial - Soleus 34.2 34.2 ?35.0 35.4 34.8 ?35.0         EMG full       EMG Summary Table    Spontaneous MUAP Recruitment  Muscle IA Fib PSW Fasc Other Amp Dur. Poly Pattern  L. Vastus medialis Normal None None None _______ Normal Normal Normal Normal  R. Vastus medialis Normal None None None _______ Normal Normal Normal Normal  L. Tibialis anterior Normal None None None _______ Normal Normal Normal Normal  R. Tibialis anterior Normal None None None _______ Normal Normal Normal Normal  L. Gastrocnemius (Medial head) Normal None None None _______ Normal Normal Normal Normal  R. Gastrocnemius (Medial head) Normal None None None _______ Normal Normal Normal Normal  L. Biceps femoris (long head) Normal None None None _______ Normal Normal Normal Normal  R. Biceps femoris (long head) Normal None None None _______ Normal Normal Normal Normal  L. Gluteus maximus Normal None None None _______ Normal Normal Normal Normal  R. Gluteus maximus Normal None None None _______ Normal Normal Normal Normal  L. Gluteus medius Normal None None None _______ Normal Normal Normal Normal  R. Gluteus medius Normal None None None _______  Normal Normal Normal Normal  R. Iliopsoas Normal None None None _______ Normal Normal Normal Normal      

## 2016-09-12 ENCOUNTER — Other Ambulatory Visit: Payer: Medicare HMO

## 2016-09-13 ENCOUNTER — Ambulatory Visit
Admission: RE | Admit: 2016-09-13 | Discharge: 2016-09-13 | Disposition: A | Discharge status: Home / Self Care | Payer: Medicare HMO | Source: Ambulatory Visit | Attending: Specialist | Admitting: Specialist

## 2016-09-13 DIAGNOSIS — M5126 Other intervertebral disc displacement, lumbar region: Secondary | ICD-10-CM | POA: Diagnosis not present

## 2016-09-13 DIAGNOSIS — R200 Anesthesia of skin: Secondary | ICD-10-CM

## 2016-09-13 DIAGNOSIS — M5136 Other intervertebral disc degeneration, lumbar region: Secondary | ICD-10-CM | POA: Diagnosis not present

## 2016-09-13 DIAGNOSIS — R29898 Other symptoms and signs involving the musculoskeletal system: Secondary | ICD-10-CM

## 2016-09-13 MED ORDER — GADOBENATE DIMEGLUMINE 529 MG/ML IV SOLN
20.0000 mL | Freq: Once | INTRAVENOUS | Status: AC | PRN
  Administered 2016-09-13: 20 mL via INTRAVENOUS

## 2016-10-02 ENCOUNTER — Ambulatory Visit (INDEPENDENT_AMBULATORY_CARE_PROVIDER_SITE_OTHER): Payer: Medicare HMO | Admitting: Specialist

## 2016-10-02 ENCOUNTER — Encounter (INDEPENDENT_AMBULATORY_CARE_PROVIDER_SITE_OTHER): Payer: Self-pay | Admitting: Specialist

## 2016-10-02 VITALS — BP 120/80 | HR 69 | Ht 75.0 in | Wt 229.0 lb

## 2016-10-02 DIAGNOSIS — M542 Cervicalgia: Secondary | ICD-10-CM | POA: Diagnosis not present

## 2016-10-02 DIAGNOSIS — R200 Anesthesia of skin: Secondary | ICD-10-CM | POA: Diagnosis not present

## 2016-10-02 DIAGNOSIS — R29898 Other symptoms and signs involving the musculoskeletal system: Secondary | ICD-10-CM | POA: Diagnosis not present

## 2016-10-02 DIAGNOSIS — M5136 Other intervertebral disc degeneration, lumbar region: Secondary | ICD-10-CM | POA: Diagnosis not present

## 2016-10-02 DIAGNOSIS — M48062 Spinal stenosis, lumbar region with neurogenic claudication: Secondary | ICD-10-CM

## 2016-10-02 MED ORDER — CITALOPRAM HYDROBROMIDE 20 MG PO TABS: 20.0000 mg | ORAL_TABLET | Freq: Every day | ORAL | 3 refills | Status: DC

## 2016-10-02 NOTE — Patient Instructions (Signed)
Plan: Avoid bending, stooping and avoid lifting weights greater than 10 lbs. Avoid prolong standing and walking. Avoid frequent bending and stooping  No lifting greater than 10 lbs. May use ice or moist heat for pain. Weight loss is of benefit. Handicap license is approved. Dr. Newton's secretary/Assistant will call to arrange for epidural steroid injection  

## 2016-10-02 NOTE — Progress Notes (Signed)
Office Visit Note   Patient: Matthew Weaver           Date of Birth: December 08, 1965           MRN: [id number] Visit Date: 10/02/2016              Requested by: Georgina Quint, MD [address] [place], [place] [address] PCP: Georgina Quint, MD   Assessment & Plan: Visit Diagnoses:  1. Cervicalgia   2. Degenerative disc disease, lumbar   3. Spinal stenosis of lumbar region with neurogenic claudication   4. Bilateral leg weakness   5. Bilateral leg numbness     Plan:Avoid bending, stooping and avoid lifting weights greater than 10 lbs. Avoid prolong standing and walking. Avoid frequent bending and stooping  No lifting greater than 10 lbs. May use ice or moist heat for pain. Weight loss is of benefit. Handicap license is approved. Dr. Clio Blas secretary/Assistant will call to arrange for epidural steroid injection     Follow-Up Instructions: No Follow-up on file.   Orders:  No orders of the defined types were placed in this encounter.  No orders of the defined types were placed in this encounter.     Procedures: No procedures performed   Clinical Data: No additional findings.   Subjective: Chief Complaint  Patient presents with  . Lower Back - Follow-up    MRI and NCS Review    51 year old male with history of lumbar fusion L3-4 in 1992 he is having persistent pain and discomfort on the lower back and with pain into the legs, and numbness into the left foot. Pain in the legs awakens him at night. Also having neck pain and so radiation into arms, no increase in clumbsiness, has had bilateral CTR in the past was loosing sensation even or holding a bottle in the hand. HE has pain in between his shoulder blades and into both shoulders.    Review of Systems  HENT: Negative.   Eyes: Positive for visual disturbance.  Respiratory: Negative.   Cardiovascular: Negative.   Gastrointestinal: Negative.   Endocrine: Negative.   Genitourinary: Negative.     Musculoskeletal: Positive for arthralgias, back pain, gait problem, neck pain and neck stiffness.  Skin: Negative.   Allergic/Immunologic: Negative.   Neurological: Positive for weakness and numbness.  Hematological: Negative.   Psychiatric/Behavioral: Negative.      Objective: Vital Signs: BP 120/80 (BP Location: Left Arm, Patient Position: Sitting)   Pulse 69   Ht  (1.905 m)   Wt 229 lb (103.9 kg)   BMI 28.62 kg/m   Physical Exam  Constitutional: He is oriented to person, place, and time. He appears well-developed and well-nourished.  HENT:  Head: Normocephalic and atraumatic.  Eyes: Pupils are equal, round, and reactive to light. EOM are normal.  Neck: Normal range of motion. Neck supple.  Pulmonary/Chest: Effort normal and breath sounds normal.  Abdominal: Soft. Bowel sounds are normal.  Neurological: He is alert and oriented to person, place, and time.  Skin: Skin is warm and dry.  Psychiatric: He has a normal mood and affect. His behavior is normal. Judgment and thought content normal.    Back Exam   Tenderness  The patient is experiencing tenderness in the cervical and lumbar.  Range of Motion  Extension: abnormal  Flexion: abnormal  Lateral Bend Right: abnormal  Lateral Bend Left: abnormal  Rotation Right: abnormal  Rotation Left: abnormal   Muscle Strength  Right Quadriceps:  5/5  Left Quadriceps:  5/5  Right Hamstrings:  5/5  Left Hamstrings:  5/5   Tests  Straight leg raise right: negative Straight leg raise left: negative  Reflexes  Patellar: 2/4 Achilles: 2/4 Biceps: 1/4 Babinski's sign: normal   Other  Toe Walk: normal Heel Walk: normal Sensation: normal Gait: normal  Erythema: no back redness Scars: absent  Comments:  Negative Hoffman's sign, slight spread with biceps testing right biceps with right finger flexion.       Specialty Comments:  No specialty comments available.  Imaging: No results found.   PMFS  History: There are no active problems to display for this patient.  Past Medical History:  Diagnosis Date  . Allergy   . Anxiety   . Cataract   . DDD (degenerative disc disease), lumbar   . Glaucoma   . Spinal stenosis     Family History  Problem Relation Age of Onset  . Cancer Mother   . Diabetes Father   . Hyperlipidemia Father   . Hypertension Father   . Stroke Father     Past Surgical History:  Procedure Laterality Date  . EYE SURGERY    . KNEE SURGERY    . SPINAL FUSION     Social History   Occupational History  . Not on file.   Social History Main Topics  . Smoking status: Former Smoker    Quit date: 05/30/1996  . Smokeless tobacco: Never Used  . Alcohol use No  . Drug use: No  . Sexual activity: Not on file

## 2016-10-08 DIAGNOSIS — H21232 Degeneration of iris (pigmentary), left eye: Secondary | ICD-10-CM | POA: Diagnosis not present

## 2016-10-08 DIAGNOSIS — H401313 Pigmentary glaucoma, right eye, severe stage: Secondary | ICD-10-CM | POA: Diagnosis not present

## 2016-10-21 ENCOUNTER — Ambulatory Visit (INDEPENDENT_AMBULATORY_CARE_PROVIDER_SITE_OTHER): Payer: Medicare HMO | Admitting: Emergency Medicine

## 2016-10-21 ENCOUNTER — Ambulatory Visit (INDEPENDENT_AMBULATORY_CARE_PROVIDER_SITE_OTHER): Payer: Medicare HMO

## 2016-10-21 ENCOUNTER — Encounter: Payer: Self-pay | Admitting: Emergency Medicine

## 2016-10-21 VITALS — BP 132/80 | HR 75 | Temp 98.4°F | Resp 17 | Ht 75.5 in | Wt 230.0 lb

## 2016-10-21 DIAGNOSIS — J069 Acute upper respiratory infection, unspecified: Secondary | ICD-10-CM | POA: Diagnosis not present

## 2016-10-21 DIAGNOSIS — R05 Cough: Secondary | ICD-10-CM

## 2016-10-21 DIAGNOSIS — R059 Cough, unspecified: Secondary | ICD-10-CM

## 2016-10-21 NOTE — Progress Notes (Signed)
Matthew Weaver 51 y.o.   Chief Complaint  Patient presents with  . Cough  . URI    HISTORY OF PRESENT ILLNESS: This is a 51 y.o. male complaining of cough and URI symptoms for several days.  URI   This is a new problem. The current episode started in the past 7 days. The problem has been waxing and waning. There has been no fever. Associated symptoms include coughing, rhinorrhea and sneezing. Pertinent negatives include no abdominal pain, chest pain, congestion, diarrhea, dysuria, ear pain, headaches, joint pain, joint swelling, nausea, neck pain, rash, sinus pain, sore throat, swollen glands, vomiting or wheezing. He has tried antihistamine (Zyrtec) for the symptoms. The treatment provided no relief.     Prior to Admission medications   Medication Sig Start Date End Date Taking? Authorizing Provider  brimonidine (ALPHAGAN P) 0.1 % SOLN    Yes [provider]  citalopram (CELEXA) 20 MG tablet Take 1 tablet (20 mg total) by mouth daily. 10/02/16  Yes Matthew Champagne, MD  diclofenac (VOLTAREN) 50 MG EC tablet Take 1 tablet (50 mg total) by mouth 3 (three) times daily. As needed 08/13/16  Yes Matthew Champagne, MD  latanoprost (XALATAN) 0.005 % ophthalmic solution 1 drop at bedtime.   Yes [provider]  timolol (BETIMOL) 0.5 % ophthalmic solution 1 drop 2 (two) times daily.   Yes [provider]  tizanidine (ZANAFLEX) 2 MG capsule Take 1 capsule (2 mg total) by mouth 3 (three) times daily as needed for muscle spasms. 08/13/16  Yes Matthew Champagne, MD    No Known Allergies  There are no active problems to display for this patient.   Past Medical History:  Diagnosis Date  . Allergy   . Anxiety   . Cataract   . DDD (degenerative disc disease), lumbar   . Glaucoma   . Spinal stenosis     Past Surgical History:  Procedure Laterality Date  . EYE SURGERY    . KNEE SURGERY    . SPINAL FUSION      Social History   Social History  . Marital status: Single   Spouse name: N/A  . Number of children: N/A  . Years of education: N/A   Occupational History  . Not on file.   Social History Main Topics  . Smoking status: Former Smoker    Quit date: 05/30/1996  . Smokeless tobacco: Never Used  . Alcohol use No  . Drug use: No  . Sexual activity: No   Other Topics Concern  . Not on file   Social History Narrative  . No narrative on file    Family History  Problem Relation Age of Onset  . Cancer Mother   . Diabetes Father   . Hyperlipidemia Father   . Hypertension Father   . Stroke Father      Review of Systems  Constitutional: Negative.  Negative for fever and weight loss.  HENT: Positive for rhinorrhea and sneezing. Negative for congestion, ear pain, nosebleeds, sinus pain and sore throat.   Eyes: Negative for discharge and redness.  Respiratory: Positive for cough. Negative for hemoptysis, shortness of breath and wheezing.   Cardiovascular: Negative for chest pain, palpitations, claudication and leg swelling.  Gastrointestinal: Negative for abdominal pain, diarrhea, nausea and vomiting.  Genitourinary: Negative for dysuria and hematuria.  Musculoskeletal: Positive for back pain (chronic). Negative for joint pain and neck pain.  Skin: Negative for rash.  Neurological: Negative for dizziness and headaches.  Endo/Heme/Allergies: Negative.   All other systems reviewed and are negative.  Vitals:   10/21/16 0840  BP: 132/80  Pulse: 75  Resp: 17  Temp: 98.4 F (36.9 C)  SpO2: 98%     Physical Exam  Constitutional: He is oriented to person, place, and time. He appears well-developed and well-nourished.  HENT:  Head: Normocephalic.  Mouth/Throat: Oropharynx is clear and moist. No oropharyngeal exudate.  Eyes: Pupils are equal, round, and reactive to light. Conjunctivae and EOM are normal.  Neck: Normal range of motion. Neck supple. No JVD present. No thyromegaly present.  Cardiovascular: Normal rate, regular rhythm and normal  heart sounds.   Pulmonary/Chest: Effort normal and breath sounds normal.  Abdominal: Soft. He exhibits no distension. There is no tenderness.  Musculoskeletal: Normal range of motion.  Lymphadenopathy:    He has no cervical adenopathy.  Neurological: He is alert and oriented to person, place, and time. No sensory deficit. He exhibits normal muscle tone.  Skin: Skin is warm and dry. Capillary refill takes less than 2 seconds. No rash noted.  Psychiatric: He has a normal mood and affect. His behavior is normal.   Dg Chest 2 View  Result Date: 10/21/2016 CLINICAL DATA:  Cough. EXAM: CHEST  2 VIEW COMPARISON:  None. FINDINGS: The heart size and mediastinal contours are within normal limits. Both lungs are clear. No pneumothorax or pleural effusion is noted. The visualized skeletal structures are unremarkable. IMPRESSION: No active cardiopulmonary disease. Electronically Signed   By: Matthew Weaver, M.D.   On: 10/21/2016 09:35     ASSESSMENT & PLAN: Matthew Weaver was seen today for cough and uri.  Diagnoses and all orders for this visit:  Cough -     DG Chest 2 View; Future  Acute upper respiratory infection    Patient Instructions       IF you received an x-ray today, you will receive an invoice from [facility]. Please contact [facility] at [phone] with questions or concerns regarding your invoice.   IF you received labwork today, you will receive an invoice from [facility]. Please contact [facility] at [phone] with questions or concerns regarding your invoice.   Our billing staff will not be able to assist you with questions regarding bills from these companies.  You will be contacted with the lab results as soon as they are available. The fastest way to get your results is to activate your My Chart account. Instructions are located on the last page of this paperwork. If you have not heard from [place] regarding the results in 2 weeks, please contact this office.        Upper Respiratory Infection, Adult Most upper respiratory infections (URIs) are caused by a virus. A URI affects the nose, throat, and upper air passages. The most common type of URI is often called "the common cold." Follow these instructions at home:  Take medicines only as told by your doctor.  Gargle warm saltwater or take cough drops to comfort your throat as told by your doctor.  Use a warm mist humidifier or inhale steam from a shower to increase air moisture. This may make it easier to breathe.  Drink enough fluid to keep your pee (urine) clear or pale yellow.  Eat soups and other clear broths.  Have a healthy diet.  Rest as needed.  Go back to work when your fever is gone or your doctor says it is okay. ? You may need to stay home longer to avoid giving  your URI to others. ? You can also wear a face mask and wash your hands often to prevent spread of the virus.  Use your inhaler more if you have asthma.  Do not use any tobacco products, including cigarettes, chewing tobacco, or electronic cigarettes. If you need help quitting, ask your doctor. Contact a doctor if:  You are getting worse, not better.  Your symptoms are not helped by medicine.  You have chills.  You are getting more short of breath.  You have brown or red mucus.  You have yellow or brown discharge from your nose.  You have pain in your face, especially when you bend forward.  You have a fever.  You have puffy (swollen) neck glands.  You have pain while swallowing.  You have white areas in the back of your throat. Get help right away if:  You have very bad or constant: ? Headache. ? Ear pain. ? Pain in your forehead, behind your eyes, and over your cheekbones (sinus pain). ? Chest pain.  You have long-lasting (chronic) lung disease and any of the following: ? Wheezing. ? Long-lasting cough. ? Coughing up blood. ? A change in your usual mucus.  You have a stiff neck.  You  have changes in your: ? Vision. ? Hearing. ? Thinking. ? Mood. This information is not intended to replace advice given to you by your health care provider. Make sure you discuss any questions you have with your health care provider. Document Released: 07/02/2007 Document Revised: 09/16/2015 Document Reviewed: 04/20/2013 Elsevier Interactive Patient Education  2018 Elsevier Inc.      Edwina Barth, MD [facility]

## 2016-10-21 NOTE — Patient Instructions (Addendum)
     IF you received an x-ray today, you will receive an invoice from [facility]. Please contact [facility] at [phone] with questions or concerns regarding your invoice.   IF you received labwork today, you will receive an invoice from [facility]. Please contact [facility] at [phone] with questions or concerns regarding your invoice.   Our billing staff will not be able to assist you with questions regarding bills from these companies.  You will be contacted with the lab results as soon as they are available. The fastest way to get your results is to activate your My Chart account. Instructions are located on the last page of this paperwork. If you have not heard from us regarding the results in 2 weeks, please contact this office.     Upper Respiratory Infection, Adult Most upper respiratory infections (URIs) are caused by a virus. A URI affects the nose, throat, and upper air passages. The most common type of URI is often called "the common cold." Follow these instructions at home:  Take medicines only as told by your doctor.  Gargle warm saltwater or take cough drops to comfort your throat as told by your doctor.  Use a warm mist humidifier or inhale steam from a shower to increase air moisture. This may make it easier to breathe.  Drink enough fluid to keep your pee (urine) clear or pale yellow.  Eat soups and other clear broths.  Have a healthy diet.  Rest as needed.  Go back to work when your fever is gone or your doctor says it is okay. ? You may need to stay home longer to avoid giving your URI to others. ? You can also wear a face mask and wash your hands often to prevent spread of the virus.  Use your inhaler more if you have asthma.  Do not use any tobacco products, including cigarettes, chewing tobacco, or electronic cigarettes. If you need help quitting, ask your doctor. Contact a doctor if:  You are getting worse, not better.  Your  symptoms are not helped by medicine.  You have chills.  You are getting more short of breath.  You have brown or red mucus.  You have yellow or brown discharge from your nose.  You have pain in your face, especially when you bend forward.  You have a fever.  You have puffy (swollen) neck glands.  You have pain while swallowing.  You have white areas in the back of your throat. Get help right away if:  You have very bad or constant: ? Headache. ? Ear pain. ? Pain in your forehead, behind your eyes, and over your cheekbones (sinus pain). ? Chest pain.  You have long-lasting (chronic) lung disease and any of the following: ? Wheezing. ? Long-lasting cough. ? Coughing up blood. ? A change in your usual mucus.  You have a stiff neck.  You have changes in your: ? Vision. ? Hearing. ? Thinking. ? Mood. This information is not intended to replace advice given to you by your health care provider. Make sure you discuss any questions you have with your health care provider. Document Released: 07/02/2007 Document Revised: 09/16/2015 Document Reviewed: 04/20/2013 Elsevier Interactive Patient Education  2018 Elsevier Inc.  

## 2016-10-27 ENCOUNTER — Ambulatory Visit (INDEPENDENT_AMBULATORY_CARE_PROVIDER_SITE_OTHER): Payer: Medicare HMO

## 2016-10-27 ENCOUNTER — Ambulatory Visit (INDEPENDENT_AMBULATORY_CARE_PROVIDER_SITE_OTHER): Payer: Medicare HMO | Admitting: Physical Medicine and Rehabilitation

## 2016-10-27 ENCOUNTER — Encounter (INDEPENDENT_AMBULATORY_CARE_PROVIDER_SITE_OTHER): Payer: Self-pay | Admitting: Physical Medicine and Rehabilitation

## 2016-10-27 VITALS — BP 133/95 | HR 88

## 2016-10-27 DIAGNOSIS — M5416 Radiculopathy, lumbar region: Secondary | ICD-10-CM | POA: Diagnosis not present

## 2016-10-27 DIAGNOSIS — M961 Postlaminectomy syndrome, not elsewhere classified: Secondary | ICD-10-CM | POA: Diagnosis not present

## 2016-10-27 MED ORDER — LIDOCAINE HCL (PF) 1 % IJ SOLN
2.0000 mL | Freq: Once | INTRAMUSCULAR | Status: AC
  Administered 2016-10-27: 2 mL

## 2016-10-27 MED ORDER — BETAMETHASONE SOD PHOS & ACET 6 (3-3) MG/ML IJ SUSP
12.0000 mg | Freq: Once | INTRAMUSCULAR | Status: AC
  Administered 2016-10-27: 12 mg

## 2016-10-27 NOTE — Patient Instructions (Signed)

## 2016-10-27 NOTE — Progress Notes (Deleted)
Last injections done 12/2015 gave about 1 month of relief, these were done in [place]. Hx of lumbar surgery L3-4-laminectomy fusion. Chronic low back pain with radicular leg pain bilaterally but right worse than left. Patient reports recent MRI ordered by Dr Otelia Sergeant showed new problem at L5-S1

## 2016-10-27 NOTE — Procedures (Signed)
[name] is a pleasant 51 year old gentleman followed by Dr. Otelia Sergeant who comes in today for requested L4-5 intralaminar epidural steroid injection to the right. Patient has had an updated MRI of the lumbar spine shows lateral recess narrowing at L4-5 as well as more left-sided issue at L5-S1. He has had prior lumbar fusion at L3-4. He did have injections performed in the lumbar spine in [place] in December of last year. He states they did help for about a month or so. He has more radicular pain on the right than left. He is also had electrodiagnostic studies by Dr. Lucia Gaskins showing mild chronic L5 radicular pattern.The injection  will be diagnostic and hopefully therapeutic. The patient has failed conservative care including time, medications and activity modification. Lumbar Epidural Steroid Injection - Interlaminar Approach with Fluoroscopic Guidance  Patient: Matthew Weaver      Date of Birth: 1966-01-22 MRN: [id number] PCP: Georgina Quint, MD      Visit Date: 10/27/2016   Universal Protocol:     Consent Given By: the patient  Position: PRONE  Additional Comments: Vital signs were monitored before and after the procedure. Patient was prepped and draped in the usual sterile fashion. The correct patient, procedure, and site was verified.   Injection Procedure Details:  Procedure Site One Meds Administered:  Meds ordered this encounter  Medications  . lidocaine (PF) (XYLOCAINE) 1 % injection 2 mL  . betamethasone acetate-betamethasone sodium phosphate (CELESTONE) injection 12 mg     Laterality: Right  Location/Site:  L4-L5  Needle size: 20 G  Needle type: Tuohy  Needle Placement: Paramedian epidural  Findings:  -Contrast Used: 0.5 mL iohexol 180 mg iodine/mL   -Comments: Excellent flow of contrast into the epidural space.  Procedure Details: Using a paramedian approach from the side mentioned above, the region overlying the inferior lamina was localized under  fluoroscopic visualization and the soft tissues overlying this structure were infiltrated with 4 ml. of 1% Lidocaine without Epinephrine. The Tuohy needle was inserted into the epidural space using a paramedian approach.   The epidural space was localized using loss of resistance along with lateral and bi-planar fluoroscopic views.  After negative aspirate for air, blood, and CSF, a 2 ml. volume of Isovue-250 was injected into the epidural space and the flow of contrast was observed. Radiographs were obtained for documentation purposes.    The injectate was administered into the level noted above.   Additional Comments:  The patient tolerated the procedure well Dressing: Band-Aid    Post-procedure details: Patient was observed during the procedure. Post-procedure instructions were reviewed.  Patient left the clinic in stable condition.

## 2016-12-05 ENCOUNTER — Ambulatory Visit: Payer: Medicare HMO | Admitting: Emergency Medicine

## 2016-12-17 ENCOUNTER — Encounter (INDEPENDENT_AMBULATORY_CARE_PROVIDER_SITE_OTHER): Payer: Self-pay | Admitting: Specialist

## 2016-12-17 ENCOUNTER — Ambulatory Visit (INDEPENDENT_AMBULATORY_CARE_PROVIDER_SITE_OTHER): Payer: Medicare HMO

## 2016-12-17 ENCOUNTER — Ambulatory Visit (INDEPENDENT_AMBULATORY_CARE_PROVIDER_SITE_OTHER): Payer: Medicare HMO | Admitting: Specialist

## 2016-12-17 VITALS — BP 130/81 | HR 69 | Ht 75.0 in | Wt 229.0 lb

## 2016-12-17 DIAGNOSIS — R200 Anesthesia of skin: Secondary | ICD-10-CM | POA: Diagnosis not present

## 2016-12-17 DIAGNOSIS — M542 Cervicalgia: Secondary | ICD-10-CM | POA: Diagnosis not present

## 2016-12-17 DIAGNOSIS — M5030 Other cervical disc degeneration, unspecified cervical region: Secondary | ICD-10-CM

## 2016-12-17 NOTE — Progress Notes (Signed)
Office Visit Note   Patient: Matthew CrazeJohn Weaver           Date of Birth: [birth year]           MRN: [id number] Visit Date: 12/17/2016              Requested by: Georgina QuintSagardia, Miguel Jose, MD [address] [place], [place] [id number] PCP: Georgina QuintSagardia, Miguel Jose, MD   Assessment & Plan: Visit Diagnoses:  1. Cervicalgia   2. Numbness in both hands   3. Other cervical disc degeneration, unspecified cervical region     Plan: Avoid overhead lifting and overhead use of the arms. Do not lift greater than 5 lbs. Adjust head rest in vehicle to prevent hyperextension if rear ended. Take extra precautions to avoid falling.  Follow-Up Instructions: Return in about 3 weeks (around 01/07/2017).   Orders:  Orders Placed This Encounter  Procedures  . XR Cervical Spine 2 or 3 views  . MR Cervical Spine w/o contrast  . Ambulatory referral to Neurology   No orders of the defined types were placed in this encounter.     Procedures: No procedures performed   Clinical Data: No additional findings.   Subjective: Chief Complaint  Patient presents with  . Neck - Follow-up  . Lower Back - Follow-up    Had right L4-5 IL injection with Dr. Alvester MorinNewton on 10/27/2016 and he did not get any relief with it.    51 year old male with history of back and leg pains. Previous lumbar fusion and previous left knee ORIF of lateral plateau fracture. He complains on neck pain and numbness and tingling into both hands and both arms and shoulders and down into the hands into the tips of the fingers left side and more intense into the left index finger. Into the right hand finger tips and burning sensation into the midline cervico dorsal and upper dorsal junction.     Review of Systems  Constitutional: Negative.   HENT: Negative.   Eyes: Negative.   Respiratory: Negative.   Cardiovascular: Negative.   Gastrointestinal: Negative.   Endocrine: Negative.   Genitourinary: Negative.   Musculoskeletal: Negative.   Skin:  Negative.   Allergic/Immunologic: Negative.   Neurological: Negative.   Hematological: Negative.   Psychiatric/Behavioral: Negative.      Objective: Vital Signs: BP 130/81 (BP Location: Left Arm, Patient Position: Sitting)   Pulse 69   Ht 6\' 3"  (1.905 m)   Wt 229 lb (103.9 kg)   BMI 28.62 kg/m   Physical Exam  Constitutional: He is oriented to person, place, and time. He appears well-developed and well-nourished.  HENT:  Head: Normocephalic and atraumatic.  Eyes: EOM are normal. Pupils are equal, round, and reactive to light.  Neck: Normal range of motion. Neck supple.  Pulmonary/Chest: Effort normal and breath sounds normal.  Abdominal: Soft. Bowel sounds are normal.  Musculoskeletal: Normal range of motion.  Neurological: He is alert and oriented to person, place, and time.  Skin: Skin is warm and dry.  Psychiatric: He has a normal mood and affect. His behavior is normal. Judgment and thought content normal.    Back Exam   Tenderness  The patient is experiencing tenderness in the lumbar and cervical.  Range of Motion  Extension: normal  Flexion: normal  Lateral bend right: normal  Lateral bend left: normal  Rotation right: normal  Rotation left: normal   Muscle Strength  Right Quadriceps:  5/5  Left Quadriceps:  5/5  Right Hamstrings:  5/5  Left Hamstrings:  5/5   Tests  Straight leg raise right: negative Straight leg raise left: negative  Reflexes  Patellar: normal Achilles: normal Babinski's sign: normal   Other  Toe walk: normal Heel walk: normal Sensation: normal Gait: normal  Erythema: no back redness Scars: absent  Comments:  Cervical spine ROM FB 3 fingerbreaths, extension is about 60% normal, lateral bending and rotation is decreased by 30-40 %,  Motor in the arm in with weakness, giving away testing arm abduction bilateral,  Tender left SSA, weak both triceps 4/5, weak both sides in finger abduction,  Hoffman's test is negative is  negative.      Specialty Comments:  No specialty comments available.  Imaging: No results found.   PMFS History: Patient Active Problem List   Diagnosis Date Noted  . Cervicalgia 02/09/2017  . Spinal stenosis of lumbar region with neurogenic claudication 02/09/2017   Past Medical History:  Diagnosis Date  . Allergy   . Anxiety   . Cataract   . DDD (degenerative disc disease), lumbar   . Glaucoma   . Spinal stenosis     Family History  Problem Relation Age of Onset  . Cancer Mother   . Diabetes Father   . Hyperlipidemia Father   . Hypertension Father   . Stroke Father   . Heart disease Father        per patient AFib    Past Surgical History:  Procedure Laterality Date  . EYE SURGERY    . KNEE SURGERY    . SPINAL FUSION     Social History   Occupational History  . Not on file  Tobacco Use  . Smoking status: Former Smoker    Last attempt to quit: 05/30/1996    Years since quitting: 20.7  . Smokeless tobacco: Never Used  Substance and Sexual Activity  . Alcohol use: No  . Drug use: No  . Sexual activity: No

## 2016-12-17 NOTE — Patient Instructions (Signed)
Avoid overhead lifting and overhead use of the arms. Do not lift greater than 5 lbs. Adjust head rest in vehicle to prevent hyperextension if rear ended. Take extra precautions to avoid falling.  

## 2017-01-04 ENCOUNTER — Other Ambulatory Visit: Payer: Medicare HMO

## 2017-01-11 ENCOUNTER — Ambulatory Visit
Admission: RE | Admit: 2017-01-11 | Discharge: 2017-01-11 | Disposition: A | Discharge status: Home / Self Care | Payer: Medicare HMO | Source: Ambulatory Visit | Attending: Specialist | Admitting: Specialist

## 2017-01-11 DIAGNOSIS — M4802 Spinal stenosis, cervical region: Secondary | ICD-10-CM | POA: Diagnosis not present

## 2017-01-11 DIAGNOSIS — M5030 Other cervical disc degeneration, unspecified cervical region: Secondary | ICD-10-CM

## 2017-01-15 ENCOUNTER — Telehealth (INDEPENDENT_AMBULATORY_CARE_PROVIDER_SITE_OTHER): Payer: Self-pay | Admitting: Specialist

## 2017-01-15 ENCOUNTER — Encounter: Payer: Medicare HMO | Admitting: Neurology

## 2017-01-15 NOTE — Telephone Encounter (Signed)
Patient was supposed to have a nerve test done today at [facility] that Dr. Otelia SergeantNitka referred him to, he said it was a miscommunication between the tech and the scheduler messed up the time so he couldn't have it done. He does not want to see [facility] again because they did not handle the situation well. Can you refer him somewhere else to get the test done?  He has an appt on the 1/3 to review the results of the test and MRI. What should he do about this appt?  CB# [phone]

## 2017-01-15 NOTE — Telephone Encounter (Signed)
Can you send the referral to Dr. Allena KatzPatel at [facility], Please?  Thanks

## 2017-01-16 ENCOUNTER — Other Ambulatory Visit (INDEPENDENT_AMBULATORY_CARE_PROVIDER_SITE_OTHER): Payer: Self-pay | Admitting: Specialist

## 2017-01-16 DIAGNOSIS — R200 Anesthesia of skin: Secondary | ICD-10-CM

## 2017-01-16 DIAGNOSIS — R202 Paresthesia of skin: Secondary | ICD-10-CM

## 2017-01-16 DIAGNOSIS — M542 Cervicalgia: Secondary | ICD-10-CM

## 2017-01-16 NOTE — Progress Notes (Signed)
Has appt on 01/29/2017

## 2017-01-22 ENCOUNTER — Ambulatory Visit (INDEPENDENT_AMBULATORY_CARE_PROVIDER_SITE_OTHER): Payer: Medicare HMO | Admitting: Specialist

## 2017-01-22 NOTE — Telephone Encounter (Signed)
Per [facility]: Patient states that he has appt with [facility] on 02-05-17 for EMG and we will not be able to do a EMg until that day. thank you for the referral

## 2017-01-29 ENCOUNTER — Ambulatory Visit (INDEPENDENT_AMBULATORY_CARE_PROVIDER_SITE_OTHER): Payer: Medicare HMO | Admitting: Specialist

## 2017-01-29 DIAGNOSIS — H401313 Pigmentary glaucoma, right eye, severe stage: Secondary | ICD-10-CM | POA: Diagnosis not present

## 2017-01-29 DIAGNOSIS — H25812 Combined forms of age-related cataract, left eye: Secondary | ICD-10-CM | POA: Diagnosis not present

## 2017-01-29 DIAGNOSIS — H21232 Degeneration of iris (pigmentary), left eye: Secondary | ICD-10-CM | POA: Diagnosis not present

## 2017-02-05 ENCOUNTER — Ambulatory Visit: Payer: Medicare HMO | Admitting: Neurology

## 2017-02-05 ENCOUNTER — Ambulatory Visit (INDEPENDENT_AMBULATORY_CARE_PROVIDER_SITE_OTHER): Payer: Medicare HMO | Admitting: Neurology

## 2017-02-05 DIAGNOSIS — M5412 Radiculopathy, cervical region: Secondary | ICD-10-CM | POA: Diagnosis not present

## 2017-02-05 DIAGNOSIS — Z0289 Encounter for other administrative examinations: Secondary | ICD-10-CM

## 2017-02-05 DIAGNOSIS — M79601 Pain in right arm: Secondary | ICD-10-CM

## 2017-02-05 DIAGNOSIS — G5603 Carpal tunnel syndrome, bilateral upper limbs: Secondary | ICD-10-CM | POA: Diagnosis not present

## 2017-02-05 NOTE — Progress Notes (Signed)
See procedure note.

## 2017-02-05 NOTE — Progress Notes (Signed)
      Full Name: Matthew Weaver Gender: Male MRN #: [id number] Date of Birth: 04/21/1965    Visit Date: 02/05/17 10:18 Age: 51 Years 4 Months Old Examining Physician: Antonia Ahern, MD  Referring Physician: James Nitka, MD    History: Neck pain and radicular symptoms  Summary: EMG/NCS was performed on the bilateral upper extremities   The left median APB motor nerve showed prolonged distal onset latency (4.6 ms, N<4.4) and decreased amplitude(3.2mv, N>4) The right median APB motor nerve showed prolonged distal onset latency (45.5 ms, N<4.4) and decreased amplitude(2mv, N>4) The right Median 2nd Digit orthodromic sensory nerve showed prolonged distal peak latency (3.6 ms, N<3.4) and decreased amplitude(9uv, N>10)  The right median/ulnar (palm) comparison nerve showed prolonged distal peak latency (Median Palm, 2.8 ms, N<2.2) and abnormal peak latency difference (Median Palm-Ulnar Palm, 0.6 ms, N<0.4) with a relative median delay.   The left median/ulnar (palm) comparison nerve showed prolonged distal peak latency (Median Palm, 2.4 ms, N<2.2) and abnormal peak latency difference (Median Palm-Ulnar Palm, 0.5 ms, N<0.4) with a relative median delay.    All remaining nerves of the upper extremities (as indicated in the following tables) were within normal limits.    EMG: Needle evaluation of the right C6 paraspinals showed slightly increased spontaneous activity.  All remaining muscles of the right and left upper extremities and cervical paraspinals (as indicated in the following table) were normal   Conclusion: .  1. EMG shows acute/ongoing denervation isolated to right C6 paraspinal level, consistent with radiculopathy. Given the lack of similar changes in limb muscles and considerable overlap in paraspinal innervation, a more precise localization cannot be identified at this time.  2. 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  Cc: Dr. Nitka  Antonia Ahern M.D.  [facility] [address] [place], [place] [address] Tel: [phone] Fax: [phone]        MNC    Nerve / Sites Muscle Latency Ref. Amplitude Ref. Rel Amp Segments Distance Velocity Ref. Area    ms ms mV mV %  cm m/s m/s mVms  L Median - APB     Wrist APB 4.6 ?4.4 3.2 ?4.0 100 Wrist - APB 7   8.6     Upper arm APB 9.6  2.9  90.9 Upper arm - Wrist 24 49 ?49 7.4  R Median - APB     Wrist APB 5.5 ?4.4 2.0 ?4.0 100 Wrist - APB 7   5.3     Upper arm APB 10.4  1.5  76.2 Upper arm - Wrist 24 50 ?49 3.8  L Ulnar - ADM     Wrist ADM 2.8 ?3.3 9.6 ?6.0 100 Wrist - ADM 7   26.4     B.Elbow ADM 7.2  8.1  84.2 B.Elbow - Wrist 22 49 ?49 24.5     A.Elbow ADM 9.1  7.9  98.1 A.Elbow - B.Elbow 10 53 ?49 24.0         A.Elbow - Wrist      R Ulnar - ADM     Wrist ADM 3.1 ?3.3 7.6 ?6.0 100 Wrist - ADM 7   25.4     B.Elbow ADM 7.6  6.4  85.1 B.Elbow - Wrist 22 49 ?49 22.7     A.Elbow ADM 9.6  6.8  106 A.Elbow - B.Elbow 10 49 ?49 25.9         A.Elbow - Wrist                   SNC    Nerve / Sites Rec. Site Peak Lat Ref.  Amp Ref. Segments Distance Peak Diff Ref.    ms ms V V  cm ms ms  L Radial - Anatomical snuff box (Forearm)     Forearm Wrist 2.6 ?2.9 10 ?10 Forearm - Wrist 10    R Radial - Anatomical snuff box (Forearm)     Forearm Wrist 2.6 ?2.9 13 ?10 Forearm - Wrist 10    L Median, Ulnar - Transcarpal comparison     Median Palm Wrist 2.4 ?2.2 35 ?35 Median Palm - Wrist 8       Ulnar Palm Wrist 2.0 ?2.2 5 ?12 Ulnar Palm - Wrist 8          Median Palm - Ulnar Palm  0.5 ?0.4  R Median, Ulnar - Transcarpal comparison     Median Palm Wrist 2.8 ?2.2 19 ?35 Median Palm - Wrist 8       Ulnar Palm Wrist 2.2 ?2.2 14 ?12 Ulnar Palm - Wrist 8          Median Palm - Ulnar Palm  0.6 ?0.4  L Median - Orthodromic (Dig II, Mid palm)     Dig II Wrist 3.3 ?3.4 17 ?10 Dig II - Wrist 13    R Median - Orthodromic (Dig II, Mid palm)     Dig  II Wrist 3.6 ?3.4 9 ?10 Dig II - Wrist 13    L Ulnar - Orthodromic, (Dig V, Mid palm)     Dig V Wrist 2.9 ?3.1 6 ?5 Dig V - Wrist 11    R Ulnar - Orthodromic, (Dig V, Mid palm)     Dig V Wrist 3.0 ?3.1 7 ?5 Dig V - Wrist 11                       F  Wave    Nerve F Lat Ref.   ms ms  L Ulnar - ADM 30.8 ?32.0  R Ulnar - ADM 31.9 ?32.0         EMG full       EMG Summary Table    Spontaneous MUAP Recruitment  Muscle IA Fib PSW Fasc Other Amp Dur. Poly Pattern  L. Deltoid Normal None None None _______ Normal Normal Normal Normal  R. Deltoid Normal None None None _______ Normal Normal Normal Normal  L. Triceps brachii Normal None None None _______ Normal Normal Normal Normal  R. Triceps brachii Normal None None None _______ Normal Normal Normal Normal  L. Biceps brachii Normal None None None _______ Normal Normal Normal Normal  R. Biceps brachii Normal None None None _______ Normal Normal Normal Normal  L. Pronator teres Normal None None None _______ Normal Normal Normal Normal  R. Pronator teres Normal None None None _______ Normal Normal Normal Normal  L. First dorsal interosseous Normal None None None _______ Normal Normal Normal Normal  R. First dorsal interosseous Normal None None None _______ Normal Normal Normal Normal  L. Opponens pollicis Normal None None None _______ Normal Normal Normal Normal  R. Opponens pollicis Normal None None None _______ Normal Normal Normal Normal  L. Cervical paraspinals (low) Normal None None None _______ Normal Normal Normal Normal  R. Cervical paraspinals (low) Normal None +1 None _______ Normal Normal Normal Normal      

## 2017-02-06 ENCOUNTER — Encounter: Payer: Self-pay | Admitting: Emergency Medicine

## 2017-02-06 ENCOUNTER — Other Ambulatory Visit: Payer: Self-pay

## 2017-02-06 ENCOUNTER — Ambulatory Visit (INDEPENDENT_AMBULATORY_CARE_PROVIDER_SITE_OTHER): Payer: Medicare HMO | Admitting: Emergency Medicine

## 2017-02-06 VITALS — BP 108/72 | HR 92 | Temp 98.2°F | Resp 16 | Ht 74.25 in | Wt 229.6 lb

## 2017-02-06 DIAGNOSIS — Z8639 Personal history of other endocrine, nutritional and metabolic disease: Secondary | ICD-10-CM

## 2017-02-06 DIAGNOSIS — M542 Cervicalgia: Secondary | ICD-10-CM | POA: Diagnosis not present

## 2017-02-06 DIAGNOSIS — Z0000 Encounter for general adult medical examination without abnormal findings: Secondary | ICD-10-CM

## 2017-02-06 DIAGNOSIS — Z13228 Encounter for screening for other metabolic disorders: Secondary | ICD-10-CM

## 2017-02-06 DIAGNOSIS — Z1329 Encounter for screening for other suspected endocrine disorder: Secondary | ICD-10-CM

## 2017-02-06 DIAGNOSIS — R200 Anesthesia of skin: Secondary | ICD-10-CM | POA: Diagnosis not present

## 2017-02-06 DIAGNOSIS — M5136 Other intervertebral disc degeneration, lumbar region: Secondary | ICD-10-CM

## 2017-02-06 DIAGNOSIS — R29898 Other symptoms and signs involving the musculoskeletal system: Secondary | ICD-10-CM | POA: Diagnosis not present

## 2017-02-06 DIAGNOSIS — Z130 Encounter for screening for diseases of the blood and blood-forming organs and certain disorders involving the immune mechanism: Secondary | ICD-10-CM

## 2017-02-06 DIAGNOSIS — M48062 Spinal stenosis, lumbar region with neurogenic claudication: Secondary | ICD-10-CM

## 2017-02-06 DIAGNOSIS — Z1321 Encounter for screening for nutritional disorder: Secondary | ICD-10-CM

## 2017-02-06 MED ORDER — CITALOPRAM HYDROBROMIDE 20 MG PO TABS: 20.0000 mg | ORAL_TABLET | Freq: Every day | ORAL | 3 refills | Status: AC

## 2017-02-06 NOTE — Patient Instructions (Addendum)
   IF you received an x-ray today, you will receive an invoice from [facility]. Please contact [facility] at [phone] with questions or concerns regarding your invoice.   IF you received labwork today, you will receive an invoice from [facility]. Please contact [facility] at [phone] with questions or concerns regarding your invoice.   Our billing staff will not be able to assist you with questions regarding bills from these companies.  You will be contacted with the lab results as soon as they are available. The fastest way to get your results is to activate your My Chart account. Instructions are located on the last page of this paperwork. If you have not heard from us regarding the results in 2 weeks, please contact this office.      Health Maintenance, Male A healthy lifestyle and preventive care is important for your health and wellness. Ask your health care provider about what schedule of regular examinations is right for you. What should I know about weight and diet? Eat a Healthy Diet  Eat plenty of vegetables, fruits, whole grains, low-fat dairy products, and lean protein.  Do not eat a lot of foods high in solid fats, added sugars, or salt.  Maintain a Healthy Weight Regular exercise can help you achieve or maintain a healthy weight. You should:  Do at least 150 minutes of exercise each week. The exercise should increase your heart rate and make you sweat (moderate-intensity exercise).  Do strength-training exercises at least twice a week.  Watch Your Levels of Cholesterol and Blood Lipids  Have your blood tested for lipids and cholesterol every 5 years starting at 54 years of age. If you are at high risk for heart disease, you should start having your blood tested when you are 54 years old. You may need to have your cholesterol levels checked more often if: ? Your lipid or cholesterol levels are high. ? You are older than 54 years of age. ? You  are at high risk for heart disease.  What should I know about cancer screening? Many types of cancers can be detected early and may often be prevented. Lung Cancer  You should be screened every year for lung cancer if: ? You are a current smoker who has smoked for at least 30 years. ? You are a former smoker who has quit within the past 15 years.  Talk to your health care provider about your screening options, when you should start screening, and how often you should be screened.  Colorectal Cancer  Routine colorectal cancer screening usually begins at 54 years of age and should be repeated every 5-10 years until you are 54 years old. You may need to be screened more often if early forms of precancerous polyps or small growths are found. Your health care provider may recommend screening at an earlier age if you have risk factors for colon cancer.  Your health care provider may recommend using home test kits to check for hidden blood in the stool.  A small camera at the end of a tube can be used to examine your colon (sigmoidoscopy or colonoscopy). This checks for the earliest forms of colorectal cancer.  Prostate and Testicular Cancer  Depending on your age and overall health, your health care provider may do certain tests to screen for prostate and testicular cancer.  Talk to your health care provider about any symptoms or concerns you have about testicular or prostate cancer.  Skin Cancer  Check your skin   from head to toe regularly.  Tell your health care provider about any new moles or changes in moles, especially if: ? There is a change in a mole's size, shape, or color. ? You have a mole that is larger than a pencil eraser.  Always use sunscreen. Apply sunscreen liberally and repeat throughout the day.  Protect yourself by wearing long sleeves, pants, a wide-brimmed hat, and sunglasses when outside.  What should I know about heart disease, diabetes, and high blood  pressure?  If you are 18-39 years of age, have your blood pressure checked every 3-5 years. If you are 40 years of age or older, have your blood pressure checked every year. You should have your blood pressure measured twice-once when you are at a hospital or clinic, and once when you are not at a hospital or clinic. Record the average of the two measurements. To check your blood pressure when you are not at a hospital or clinic, you can use: ? An automated blood pressure machine at a pharmacy. ? A home blood pressure monitor.  Talk to your health care provider about your target blood pressure.  If you are between 45-79 years old, ask your health care provider if you should take aspirin to prevent heart disease.  Have regular diabetes screenings by checking your fasting blood sugar level. ? If you are at a normal weight and have a low risk for diabetes, have this test once every three years after the age of 45. ? If you are overweight and have a high risk for diabetes, consider being tested at a younger age or more often.  A one-time screening for abdominal aortic aneurysm (AAA) by ultrasound is recommended for men aged 65-75 years who are current or former smokers. What should I know about preventing infection? Hepatitis B If you have a higher risk for hepatitis B, you should be screened for this virus. Talk with your health care provider to find out if you are at risk for hepatitis B infection. Hepatitis C Blood testing is recommended for:  Everyone born from 1945 through 1965.  Anyone with known risk factors for hepatitis C.  Sexually Transmitted Diseases (STDs)  You should be screened each year for STDs including gonorrhea and chlamydia if: ? You are sexually active and are younger than 54 years of age. ? You are older than 54 years of age and your health care provider tells you that you are at risk for this type of infection. ? Your sexual activity has changed since you were last  screened and you are at an increased risk for chlamydia or gonorrhea. Ask your health care provider if you are at risk.  Talk with your health care provider about whether you are at high risk of being infected with HIV. Your health care provider may recommend a prescription medicine to help prevent HIV infection.  What else can I do?  Schedule regular health, dental, and eye exams.  Stay current with your vaccines (immunizations).  Do not use any tobacco products, such as cigarettes, chewing tobacco, and e-cigarettes. If you need help quitting, ask your health care provider.  Limit alcohol intake to no more than 2 drinks per day. One drink equals 12 ounces of beer, 5 ounces of wine, or 1 ounces of hard liquor.  Do not use street drugs.  Do not share needles.  Ask your health care provider for help if you need support or information about quitting drugs.  Tell your health care   provider if you often feel depressed.  Tell your health care provider if you have ever been abused or do not feel safe at home. This information is not intended to replace advice given to you by your health care provider. Make sure you discuss any questions you have with your health care provider. Document Released: 07/12/2007 Document Revised: 09/12/2015 Document Reviewed: 10/17/2014 Elsevier Interactive Patient Education  2018 [employer]  American Heart Association (AHA) Exercise Recommendation  Being physically active is important to prevent heart disease and stroke, the nation's No. 1and No. 5killers. To improve overall cardiovascular health, we suggest at least 150 minutes per week of moderate exercise or 75 minutes per week of vigorous exercise (or a combination of moderate and vigorous activity). Thirty minutes a day, five times a week is an easy goal to remember. You will also experience benefits even if you divide your time into two or three segments of 10 to 15 minutes per day.  For people who would  benefit from lowering their blood pressure or cholesterol, we recommend 40 minutes of aerobic exercise of moderate to vigorous intensity three to four times a week to lower the risk for heart attack and stroke.  Physical activity is anything that makes you move your body and burn calories.  This includes things like climbing stairs or playing sports. Aerobic exercises benefit your heart, and include walking, jogging, swimming or biking. Strength and stretching exercises are best for overall stamina and flexibility.  The simplest, positive change you can make to effectively improve your heart health is to start walking. It's enjoyable, free, easy, social and great exercise. A walking program is flexible and boasts high success rates because people can stick with it. It's easy for walking to become a regular and satisfying part of life.   For Overall Cardiovascular Health:  At least 30 minutes of moderate-intensity aerobic activity at least 5 days per week for a total of 150  OR   At least 25 minutes of vigorous aerobic activity at least 3 days per week for a total of 75 minutes; or a combination of moderate- and vigorous-intensity aerobic activity  AND   Moderate- to high-intensity muscle-strengthening activity at least 2 days per week for additional health benefits.  For Lowering Blood Pressure and Cholesterol  An average 40 minutes of moderate- to vigorous-intensity aerobic activity 3 or 4 times per week  What if I can't make it to the time goal? Something is always better than nothing! And everyone has to start somewhere. Even if you've been sedentary for years, today is the day you can begin to make healthy changes in your life. If you don't think you'll make it for 30 or 40 minutes, set a reachable goal for today. You can work up toward your overall goal by increasing your time as you get stronger. Don't let all-or-nothing thinking rob you of doing what you can every day.   Source:http://www.heart.org    

## 2017-02-07 LAB — LIPID PANEL
CHOLESTEROL TOTAL: 187 mg/dL (ref 100–199)
Chol/HDL Ratio: 3.3 ratio (ref 0.0–5.0)
HDL: 56 mg/dL (ref >39)
LDL Calculated: 108 mg/dL — ABNORMAL HIGH (ref 0–99)
Triglycerides: 116 mg/dL (ref 0–149)
VLDL CHOLESTEROL CAL: 23 mg/dL (ref 5–40)

## 2017-02-07 LAB — BASIC METABOLIC PANEL
BUN / CREAT RATIO: 11 (ref 9–20)
BUN: 12 mg/dL (ref 6–24)
CHLORIDE: 106 mmol/L (ref 96–106)
CO2: 22 mmol/L (ref 20–29)
Calcium: 9.6 mg/dL (ref 8.7–10.2)
Creatinine, Ser: 1.1 mg/dL (ref 0.76–1.27)
GFR calc non Af Amer: 76 mL/min/{1.73_m2} (ref >59)
GFR, EST AFRICAN AMERICAN: 88 mL/min/{1.73_m2} (ref >59)
GLUCOSE: 96 mg/dL (ref 65–99)
POTASSIUM: 4.3 mmol/L (ref 3.5–5.2)
SODIUM: 144 mmol/L (ref 134–144)

## 2017-02-07 LAB — HEMOGLOBIN A1C
ESTIMATED AVERAGE GLUCOSE: 111 mg/dL
Hgb A1c MFr Bld: 5.5 % (ref 4.8–5.6)

## 2017-02-07 LAB — PSA: PROSTATE SPECIFIC AG, SERUM: 1.5 ng/mL (ref 0.0–4.0)

## 2017-02-09 DIAGNOSIS — M48062 Spinal stenosis, lumbar region with neurogenic claudication: Secondary | ICD-10-CM | POA: Insufficient documentation

## 2017-02-09 DIAGNOSIS — M542 Cervicalgia: Secondary | ICD-10-CM | POA: Insufficient documentation

## 2017-02-09 NOTE — Progress Notes (Signed)
Matthew Weaver 52 y.o.   Chief Complaint  Patient presents with  . Annual Exam    HISTORY OF PRESENT ILLNESS: This is a 52 y.o. male Here for annual exam; no complaints and no medical concerns.   HPI   Prior to Admission medications   Medication Sig Start Date End Date Taking? Authorizing Provider  brimonidine (ALPHAGAN P) 0.1 % SOLN    Yes [provider]  citalopram (CELEXA) 20 MG tablet Take 1 tablet (20 mg total) by mouth daily. 02/06/17 05/07/17 Yes Ly Bacchi, Eilleen Kempf, MD  diclofenac (VOLTAREN) 50 MG EC tablet Take 1 tablet (50 mg total) by mouth 3 (three) times daily. As needed 08/13/16  Yes Kerrin Champagne, MD  latanoprost (XALATAN) 0.005 % ophthalmic solution 1 drop at bedtime.   Yes [provider]  timolol (BETIMOL) 0.5 % ophthalmic solution 1 drop 2 (two) times daily.   Yes [provider]  tizanidine (ZANAFLEX) 2 MG capsule Take 1 capsule (2 mg total) by mouth 3 (three) times daily as needed for muscle spasms. 08/13/16  Yes Kerrin Champagne, MD    No Known Allergies  Patient Active Problem List   Diagnosis Date Noted  . Cough 10/21/2016  . Acute upper respiratory infection 10/21/2016    Past Medical History:  Diagnosis Date  . Allergy   . Anxiety   . Cataract   . DDD (degenerative disc disease), lumbar   . Glaucoma   . Spinal stenosis     Past Surgical History:  Procedure Laterality Date  . EYE SURGERY    . KNEE SURGERY    . SPINAL FUSION      Social History   Socioeconomic History  . Marital status: Single    Spouse name: Not on file  . Number of children: Not on file  . Years of education: Not on file  . Highest education level: Not on file  Social Needs  . Financial resource strain: Not on file  . Food insecurity - worry: Not on file  . Food insecurity - inability: Not on file  . Transportation needs - medical: Not on file  . Transportation needs - non-medical: Not on file  Occupational History  . Not on file    Tobacco Use  . Smoking status: Former Smoker    Last attempt to quit: 05/30/1996    Years since quitting: 20.7  . Smokeless tobacco: Never Used  Substance and Sexual Activity  . Alcohol use: No  . Drug use: No  . Sexual activity: No  Other Topics Concern  . Not on file  Social History Narrative  . Not on file    Family History  Problem Relation Age of Onset  . Cancer Mother   . Diabetes Father   . Hyperlipidemia Father   . Hypertension Father   . Stroke Father   . Heart disease Father        per patient AFib     Review of Systems  Constitutional: Negative.  Negative for chills, fever and weight loss.  HENT: Negative.  Negative for congestion, hearing loss, nosebleeds and sore throat.   Eyes: Negative.  Negative for blurred vision, double vision and pain.  Respiratory: Negative.  Negative for cough, hemoptysis, shortness of breath and wheezing.   Cardiovascular: Negative.  Negative for chest pain, palpitations and leg swelling.  Gastrointestinal: Negative.  Negative for abdominal pain, blood in stool, diarrhea, melena, nausea and vomiting.  Genitourinary: Negative.  Negative for dysuria and hematuria.  Musculoskeletal: Positive for back pain (chronic) and neck pain (chronic).  Skin: Negative.  Negative for rash.  Neurological: Negative.  Negative for dizziness, sensory change, focal weakness and headaches.  Endo/Heme/Allergies: Negative.   All other systems reviewed and are negative.  Vitals:   02/06/17 1032  BP: 108/72  Pulse: 92  Resp: 16  Temp: 98.2 F (36.8 C)  SpO2: 96%     Physical Exam  Constitutional: He is oriented to person, place, and time. He appears well-developed and well-nourished.  HENT:  Head: Normocephalic and atraumatic.  Right Ear: External ear normal.  Left Ear: External ear normal.  Nose: Nose normal.  Mouth/Throat: Oropharynx is clear and moist.  Eyes: Conjunctivae and EOM are normal. Pupils are equal, round, and reactive to light.   Neck: Normal range of motion. Neck supple. No JVD present. No thyromegaly present.  Cardiovascular: Normal rate, regular rhythm and normal heart sounds.  Pulmonary/Chest: Effort normal and breath sounds normal.  Abdominal: Soft. Bowel sounds are normal. He exhibits no distension and no mass. There is no tenderness.  Musculoskeletal: Normal range of motion.  Lymphadenopathy:    He has no cervical adenopathy.  Neurological: He is alert and oriented to person, place, and time. No sensory deficit. He exhibits normal muscle tone.  Skin: Skin is warm and dry. Capillary refill takes less than 2 seconds. No rash noted.  Psychiatric: He has a normal mood and affect. His behavior is normal.  Vitals reviewed.    ASSESSMENT & PLAN: Matthew Weaver was seen today for annual exam.  Diagnoses and all orders for this visit:  Routine general medical examination at a health care facility -     Basic metabolic panel -     Lipid panel -     PSA(Must document that pt has been informed of limitations of PSA testing.) -     Hemoglobin A1c -     citalopram (CELEXA) 20 MG tablet; Take 1 tablet (20 mg total) by mouth daily.  Cervicalgia -     citalopram (CELEXA) 20 MG tablet; Take 1 tablet (20 mg total) by mouth daily.  Degenerative disc disease, lumbar -     citalopram (CELEXA) 20 MG tablet; Take 1 tablet (20 mg total) by mouth daily.  Spinal stenosis of lumbar region with neurogenic claudication -     citalopram (CELEXA) 20 MG tablet; Take 1 tablet (20 mg total) by mouth daily.  Bilateral leg weakness -     citalopram (CELEXA) 20 MG tablet; Take 1 tablet (20 mg total) by mouth daily.  Bilateral leg numbness -     citalopram (CELEXA) 20 MG tablet; Take 1 tablet (20 mg total) by mouth daily.    Patient Instructions       IF you received an x-ray today, you will receive an invoice from [facility]. Please contact [facility] at [phone] with questions or concerns regarding your  invoice.   IF you received labwork today, you will receive an invoice from [facility]. Please contact [facility] at [phone] with questions or concerns regarding your invoice.   Our billing staff will not be able to assist you with questions regarding bills from these companies.  You will be contacted with the lab results as soon as they are available. The fastest way to get your results is to activate your My Chart account. Instructions are located on the last page of this paperwork. If you have not heard from [place] regarding the results in 2 weeks, please contact this office.  Health Maintenance, Male A healthy lifestyle and preventive care is important for your health and wellness. Ask your health care provider about what schedule of regular examinations is right for you. What should I know about weight and diet? Eat a Healthy Diet  Eat plenty of vegetables, fruits, whole grains, low-fat dairy products, and lean protein.  Do not eat a lot of foods high in solid fats, added sugars, or salt.  Maintain a Healthy Weight Regular exercise can help you achieve or maintain a healthy weight. You should:  Do at least 150 minutes of exercise each week. The exercise should increase your heart rate and make you sweat (moderate-intensity exercise).  Do strength-training exercises at least twice a week.  Watch Your Levels of Cholesterol and Blood Lipids  Have your blood tested for lipids and cholesterol every 5 years starting at 54 years of age. If you are at high risk for heart disease, you should start having your blood tested when you are 54 years old. You may need to have your cholesterol levels checked more often if: ? Your lipid or cholesterol levels are high. ? You are older than 54 years of age. ? You are at high risk for heart disease.  What should I know about cancer screening? Many types of cancers can be detected early and may often be prevented. Lung Cancer  You should be  screened every year for lung cancer if: ? You are a current smoker who has smoked for at least 30 years. ? You are a former smoker who has quit within the past 15 years.  Talk to your health care provider about your screening options, when you should start screening, and how often you should be screened.  Colorectal Cancer  Routine colorectal cancer screening usually begins at 54 years of age and should be repeated every 5-10 years until you are 54 years old. You may need to be screened more often if early forms of precancerous polyps or small growths are found. Your health care provider may recommend screening at an earlier age if you have risk factors for colon cancer.  Your health care provider may recommend using home test kits to check for hidden blood in the stool.  A small camera at the end of a tube can be used to examine your colon (sigmoidoscopy or colonoscopy). This checks for the earliest forms of colorectal cancer.  Prostate and Testicular Cancer  Depending on your age and overall health, your health care provider may do certain tests to screen for prostate and testicular cancer.  Talk to your health care provider about any symptoms or concerns you have about testicular or prostate cancer.  Skin Cancer  Check your skin from head to toe regularly.  Tell your health care provider about any new moles or changes in moles, especially if: ? There is a change in a mole's size, shape, or color. ? You have a mole that is larger than a pencil eraser.  Always use sunscreen. Apply sunscreen liberally and repeat throughout the day.  Protect yourself by wearing long sleeves, pants, a wide-brimmed hat, and sunglasses when outside.  What should I know about heart disease, diabetes, and high blood pressure?  If you are 5918-54 years of age, have your blood pressure checked every 3-5 years. If you are 54 years of age or older, have your blood pressure checked every year. You should have  your blood pressure measured twice-once when you are at a hospital or clinic, and once  when you are not at a hospital or clinic. Record the average of the two measurements. To check your blood pressure when you are not at a hospital or clinic, you can use: ? An automated blood pressure machine at a pharmacy. ? A home blood pressure monitor.  Talk to your health care provider about your target blood pressure.  If you are between 93-78 years old, ask your health care provider if you should take aspirin to prevent heart disease.  Have regular diabetes screenings by checking your fasting blood sugar level. ? If you are at a normal weight and have a low risk for diabetes, have this test once every three years after the age of 83. ? If you are overweight and have a high risk for diabetes, consider being tested at a younger age or more often.  A one-time screening for abdominal aortic aneurysm (AAA) by ultrasound is recommended for men aged 65-75 years who are current or former smokers. What should I know about preventing infection? Hepatitis B If you have a higher risk for hepatitis B, you should be screened for this virus. Talk with your health care provider to find out if you are at risk for hepatitis B infection. Hepatitis C Blood testing is recommended for:  Everyone born from [birth year] through 1967.  Anyone with known risk factors for hepatitis C.  Sexually Transmitted Diseases (STDs)  You should be screened each year for STDs including gonorrhea and chlamydia if: ? You are sexually active and are younger than 54 years of age. ? You are older than 54 years of age and your health care provider tells you that you are at risk for this type of infection. ? Your sexual activity has changed since you were last screened and you are at an increased risk for chlamydia or gonorrhea. Ask your health care provider if you are at risk.  Talk with your health care provider about whether you are at high risk  of being infected with HIV. Your health care provider may recommend a prescription medicine to help prevent HIV infection.  What else can I do?  Schedule regular health, dental, and eye exams.  Stay current with your vaccines (immunizations).  Do not use any tobacco products, such as cigarettes, chewing tobacco, and e-cigarettes. If you need help quitting, ask your health care provider.  Limit alcohol intake to no more than 2 drinks per day. One drink equals 12 ounces of beer, 5 ounces of wine, or 1 ounces of hard liquor.  Do not use street drugs.  Do not share needles.  Ask your health care provider for help if you need support or information about quitting drugs.  Tell your health care provider if you often feel depressed.  Tell your health care provider if you have ever been abused or do not feel safe at home. This information is not intended to replace advice given to you by your health care provider. Make sure you discuss any questions you have with your health care provider. Document Released: 07/12/2007 Document Revised: 09/12/2015 Document Reviewed: 10/17/2014 Elsevier Interactive Patient Education  2018 [employer].  American Heart Association (AHA) Exercise Recommendation  Being physically active is important to prevent heart disease and stroke, the nation's No. 1and No. 5killers. To improve overall cardiovascular health, we suggest at least 150 minutes per week of moderate exercise or 75 minutes per week of vigorous exercise (or a combination of moderate and vigorous activity). Thirty minutes a day, five times a week  is an easy goal to remember. You will also experience benefits even if you divide your time into two or three segments of 10 to 15 minutes per day.  For people who would benefit from lowering their blood pressure or cholesterol, we recommend 40 minutes of aerobic exercise of moderate to vigorous intensity three to four times a week to lower the risk for heart  attack and stroke.  Physical activity is anything that makes you move your body and burn calories.  This includes things like climbing stairs or playing sports. Aerobic exercises benefit your heart, and include walking, jogging, swimming or biking. Strength and stretching exercises are best for overall stamina and flexibility.  The simplest, positive change you can make to effectively improve your heart health is to start walking. It's enjoyable, free, easy, social and great exercise. A walking program is flexible and boasts high success rates because people can stick with it. It's easy for walking to become a regular and satisfying part of life.   For Overall Cardiovascular Health:  At least 30 minutes of moderate-intensity aerobic activity at least 5 days per week for a total of 150  OR   At least 25 minutes of vigorous aerobic activity at least 3 days per week for a total of 75 minutes; or a combination of moderate- and vigorous-intensity aerobic activity  AND   Moderate- to high-intensity muscle-strengthening activity at least 2 days per week for additional health benefits.  For Lowering Blood Pressure and Cholesterol  An average 40 minutes of moderate- to vigorous-intensity aerobic activity 3 or 4 times per week  What if I can't make it to the time goal? Something is always better than nothing! And everyone has to start somewhere. Even if you've been sedentary for years, today is the day you can begin to make healthy changes in your life. If you don't think you'll make it for 30 or 40 minutes, set a reachable goal for today. You can work up toward your overall goal by increasing your time as you get stronger. Don't let all-or-nothing thinking rob you of doing what you can every day.  Source:[email or web address] [facility]

## 2017-02-09 NOTE — Procedures (Signed)
Full Name: Matthew Weaver Gender: Male MRN #: [id number] Date of Birth: 30-May-2065    Visit Date: 02/05/17 10:18 Age: 52 Years 4 Months Old Examining Physician: Naomie Dean, MD  Referring Physician: Vira Browns, MD    History: Neck pain and radicular symptoms  Summary: EMG/NCS was performed on the bilateral upper extremities   The left median APB motor nerve showed prolonged distal onset latency (4.6 ms, N<4.4) and decreased amplitude(3.84mv, N>4) The right median APB motor nerve showed prolonged distal onset latency (45.5 ms, N<4.4) and decreased amplitude(60mv, N>4) The right Median 2nd Digit orthodromic sensory nerve showed prolonged distal peak latency (3.6 ms, N<3.4) and decreased amplitude(9uv, N>10)  The right median/ulnar (palm) comparison nerve showed prolonged distal peak latency (Median Palm, 2.8 ms, N<2.2) and abnormal peak latency difference (Median Palm-Ulnar Palm, 0.6 ms, N<0.4) with a relative median delay.   The left median/ulnar (palm) comparison nerve showed prolonged distal peak latency (Median Palm, 2.4 ms, N<2.2) and abnormal peak latency difference (Median Palm-Ulnar Palm, 0.5 ms, N<0.4) with a relative median delay.    All remaining nerves of the upper extremities (as indicated in the following tables) were within normal limits.    EMG: Needle evaluation of the right C6 paraspinals showed slightly increased spontaneous activity.  All remaining muscles of the right and left upper extremities and cervical paraspinals (as indicated in the following table) were normal   Conclusion: .  1. EMG shows acute/ongoing denervation isolated to right C6 paraspinal level, consistent with radiculopathy. Given the lack of similar changes in limb muscles and considerable overlap in paraspinal innervation, a more precise localization cannot be identified at this time.  2. 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  Cc: Dr. Beatriz Stallion M.D.  [facility] [address] [place], [place] [address] Tel: [phone] Fax: [phone]        [facility]    Nerve / Sites Muscle Latency Ref. Amplitude Ref. Rel Amp Segments Distance Velocity Ref. Area    ms ms mV mV %  cm m/s m/s mVms  L Median - APB     Wrist APB 4.6 ?4.4 3.2 ?4.0 100 Wrist - APB 7   8.6     Upper arm APB 9.6  2.9  90.9 Upper arm - Wrist 24 49 ?49 7.4  R Median - APB     Wrist APB 5.5 ?4.4 2.0 ?4.0 100 Wrist - APB 7   5.3     Upper arm APB 10.4  1.5  76.2 Upper arm - Wrist 24 50 ?49 3.8  L Ulnar - ADM     Wrist ADM 2.8 ?3.3 9.6 ?6.0 100 Wrist - ADM 7   26.4     B.Elbow ADM 7.2  8.1  84.2 B.Elbow - Wrist 22 49 ?49 24.5     A.Elbow ADM 9.1  7.9  98.1 A.Elbow - B.Elbow 10 53 ?49 24.0         A.Elbow - Wrist      R Ulnar - ADM     Wrist ADM 3.1 ?3.3 7.6 ?6.0 100 Wrist - ADM 7   25.4     B.Elbow ADM 7.6  6.4  85.1 B.Elbow - Wrist 22 49 ?49 22.7     A.Elbow ADM 9.6  6.8  106 A.Elbow - B.Elbow 10 49 ?49 25.9         A.Elbow - Wrist  SNC    Nerve / Sites Rec. Site Peak Lat Ref.  Amp Ref. Segments Distance Peak Diff Ref.    ms ms V V  cm ms ms  L Radial - Anatomical snuff box (Forearm)     Forearm Wrist 2.6 ?2.9 10 ?10 Forearm - Wrist 10    R Radial - Anatomical snuff box (Forearm)     Forearm Wrist 2.6 ?2.9 13 ?10 Forearm - Wrist 10    L Median, Ulnar - Transcarpal comparison     Median Palm Wrist 2.4 ?2.2 35 ?35 Median Palm - Wrist 8       Ulnar Palm Wrist 2.0 ?2.2 5 ?12 Ulnar Palm - Wrist 8          Median Palm - Ulnar Palm  0.5 ?0.4  R Median, Ulnar - Transcarpal comparison     Median Palm Wrist 2.8 ?2.2 19 ?35 Median Palm - Wrist 8       Ulnar Palm Wrist 2.2 ?2.2 14 ?12 Ulnar Palm - Wrist 8          Median Palm - Ulnar Palm  0.6 ?0.4  L Median - Orthodromic (Dig II, Mid palm)     Dig II Wrist 3.3 ?3.4 17 ?10 Dig II - Wrist 13    R Median - Orthodromic (Dig II, Mid palm)     Dig  II Wrist 3.6 ?3.4 9 ?10 Dig II - Wrist 13    L Ulnar - Orthodromic, (Dig V, Mid palm)     Dig V Wrist 2.9 ?3.1 6 ?5 Dig V - Wrist 11    R Ulnar - Orthodromic, (Dig V, Mid palm)     Dig V Wrist 3.0 ?3.1 7 ?5 Dig V - Wrist 8911                       F  Wave    Nerve F Lat Ref.   ms ms  L Ulnar - ADM 30.8 ?32.0  R Ulnar - ADM 31.9 ?32.0         EMG full       EMG Summary Table    Spontaneous MUAP Recruitment  Muscle IA Fib PSW Fasc Other Amp Dur. Poly Pattern  L. Deltoid Normal None None None _______ Normal Normal Normal Normal  R. Deltoid Normal None None None _______ Normal Normal Normal Normal  L. Triceps brachii Normal None None None _______ Normal Normal Normal Normal  R. Triceps brachii Normal None None None _______ Normal Normal Normal Normal  L. Biceps brachii Normal None None None _______ Normal Normal Normal Normal  R. Biceps brachii Normal None None None _______ Normal Normal Normal Normal  L. Pronator teres Normal None None None _______ Normal Normal Normal Normal  R. Pronator teres Normal None None None _______ Normal Normal Normal Normal  L. First dorsal interosseous Normal None None None _______ Normal Normal Normal Normal  R. First dorsal interosseous Normal None None None _______ Normal Normal Normal Normal  L. Opponens pollicis Normal None None None _______ Normal Normal Normal Normal  R. Opponens pollicis Normal None None None _______ Normal Normal Normal Normal  L. Cervical paraspinals (low) Normal None None None _______ Normal Normal Normal Normal  R. Cervical paraspinals (low) Normal None +1 None _______ Normal Normal Normal Normal

## 2017-02-10 ENCOUNTER — Encounter: Payer: Self-pay | Admitting: Radiology

## 2017-02-12 ENCOUNTER — Telehealth: Payer: Self-pay | Admitting: Emergency Medicine

## 2017-02-12 NOTE — Telephone Encounter (Signed)
Patient had two different forms brought in for him to get approved to have a service dog in his apartment. I have looked through the patients chart and I do not see anything stated that he needs a service dog--I will place the forms in Dr Latrelle DodrillSagardia's box on 02/12/17 if you can complete them then please return them to the FMLA/Disability box at the 102 checkout desk within 5-7 business days.  If you cannot complete the forms let me know and I will contact the patient and let him know.  Thank you

## 2017-02-13 ENCOUNTER — Telehealth: Payer: Self-pay | Admitting: *Deleted

## 2017-02-13 NOTE — Telephone Encounter (Signed)
Done

## 2017-02-13 NOTE — Telephone Encounter (Signed)
Faxed completed and signed forms to [place] apartment. Confirmation page received at 1:15 pm.

## 2017-02-19 ENCOUNTER — Ambulatory Visit (INDEPENDENT_AMBULATORY_CARE_PROVIDER_SITE_OTHER): Payer: Medicare HMO | Admitting: Specialist

## 2017-02-19 ENCOUNTER — Encounter (INDEPENDENT_AMBULATORY_CARE_PROVIDER_SITE_OTHER): Payer: Self-pay | Admitting: Specialist

## 2017-02-19 VITALS — BP 124/83 | HR 83 | Ht 75.0 in | Wt 229.0 lb

## 2017-02-19 DIAGNOSIS — M5412 Radiculopathy, cervical region: Secondary | ICD-10-CM | POA: Diagnosis not present

## 2017-02-19 DIAGNOSIS — G5603 Carpal tunnel syndrome, bilateral upper limbs: Secondary | ICD-10-CM | POA: Diagnosis not present

## 2017-02-19 DIAGNOSIS — M4802 Spinal stenosis, cervical region: Secondary | ICD-10-CM

## 2017-02-19 NOTE — Patient Instructions (Signed)
Avoid overhead lifting and overhead use of the arms. Do not lift greater than 5 lbs. Adjust head rest in vehicle to prevent hyperextension if rear ended. Take extra precautions to avoid falling. MRI of the carpal tunnels to assess the nerves for any residual nerve compression as you have had previous surgery to both wrists, carpal tunnel release.  Selective nerve blocks of the neck to determine is nerve irritation at the cervical level is the source of persistent severe pain in the scapula and lower right neck.  Hand surgery consult to assess for possible need for redo carpal tunnel release.

## 2017-02-19 NOTE — Progress Notes (Signed)
Office Visit Note   Patient: Matthew CrazeJohn Weaver           Date of Birth: 1965/05/02           MRN: [id number] Visit Date: 02/19/2017              Requested by: Georgina QuintSagardia, Miguel Jose, MD [address] [place], [place] [address] PCP: Georgina QuintSagardia, Miguel Jose, MD   Assessment & Plan: Visit Diagnoses:  1. Spinal stenosis of cervical region   2. Radiculopathy, cervical region   3. Carpal tunnel syndrome, bilateral     Plan: Avoid overhead lifting and overhead use of the arms. Do not lift greater than 5 lbs. Adjust head rest in vehicle to prevent hyperextension if rear ended. Take extra precautions to avoid falling. MRI of the carpal tunnels to assess the nerves for any residual nerve compression as you have had previous surgery to both wrists, carpal tunnel release.  Selective nerve blocks of the neck to determine is nerve irritation at the cervical level is the source of persistent severe pain in the scapula and lower right neck.  Hand surgery consult to assess for possible need for redo carpal tunnel release.   Follow-Up Instructions: Return in about 3 weeks (around 03/12/2017).   Orders:  No orders of the defined types were placed in this encounter.  No orders of the defined types were placed in this encounter.     Procedures: No procedures performed   Clinical Data: No additional findings.   Subjective: Chief Complaint  Patient presents with  . Neck - Follow-up    Post MRI and EMG Reviews    52 year old right handed male with persisting neck and arm pain underwent right posterior neck and upper scapula. He had MRI of the cervical spine and EMG/NCV of the right arm and cervical spine. The results of the studies shows bilateral right greater than left CTS. The MRI with borderline to mild bilateral foramenal narrowing C3-4 and mild bilateral foramenal narrowing C4-5, central canal stenosis without cord compression at C5-6 with left greater than right foramenal stenosis. A small non  compressive disc herniation C6-7 right side. No bowel or bladder difficulty.    Review of Systems   Objective: Vital Signs: BP 124/83 (BP Location: Left Arm, Patient Position: Sitting)   Pulse 83   Ht 6\' 3"  (1.905 m)   Wt 229 lb (103.9 kg)   BMI 28.62 kg/m   Physical Exam  Ortho Exam  Specialty Comments:  No specialty comments available.  Imaging: No results found.   PMFS History: Patient Active Problem List   Diagnosis Date Noted  . Cervicalgia 02/09/2017  . Spinal stenosis of lumbar region with neurogenic claudication 02/09/2017   Past Medical History:  Diagnosis Date  . Allergy   . Anxiety   . Cataract   . DDD (degenerative disc disease), lumbar   . Glaucoma   . Spinal stenosis     Family History  Problem Relation Age of Onset  . Cancer Mother   . Diabetes Father   . Hyperlipidemia Father   . Hypertension Father   . Stroke Father   . Heart disease Father        per patient AFib    Past Surgical History:  Procedure Laterality Date  . EYE SURGERY    . KNEE SURGERY    . SPINAL FUSION     Social History   Occupational History  . Not on file  Tobacco Use  . Smoking  status: Former Smoker    Last attempt to quit: 05/30/1996    Years since quitting: 20.7  . Smokeless tobacco: Never Used  Substance and Sexual Activity  . Alcohol use: No  . Drug use: No  . Sexual activity: No

## 2017-02-19 NOTE — Telephone Encounter (Signed)
Paperwork scanned and faxed on 02/16/17 °

## 2017-02-20 ENCOUNTER — Telehealth (INDEPENDENT_AMBULATORY_CARE_PROVIDER_SITE_OTHER): Payer: Self-pay | Admitting: *Deleted

## 2017-02-25 ENCOUNTER — Telehealth: Payer: Self-pay | Admitting: Emergency Medicine

## 2017-02-25 NOTE — Telephone Encounter (Signed)
Copied from CRM [id number]. Topic: Quick Communication - See Telephone Encounter >> Feb 25, 2017 10:13 AM Eston Mouldavis, Tayloranne Lekas B wrote: CRM for notification. See Telephone encounter for:  Pt requesting lab results   02/25/17.

## 2017-02-25 NOTE — Telephone Encounter (Signed)
LMOVM re: letter sent with lab results on 1/15, so he should have received or possibly receive today.

## 2017-02-25 NOTE — Telephone Encounter (Signed)
Called pt offered the next available appt which is 03/04/17. He did not accept that appt. He wants a date sooner preferably the week of 02/23/17. He wants to be referred to another facility. ----Please advise

## 2017-03-05 ENCOUNTER — Encounter (INDEPENDENT_AMBULATORY_CARE_PROVIDER_SITE_OTHER): Payer: Self-pay | Admitting: Physical Medicine and Rehabilitation

## 2017-03-05 ENCOUNTER — Ambulatory Visit (INDEPENDENT_AMBULATORY_CARE_PROVIDER_SITE_OTHER): Payer: Medicare HMO

## 2017-03-05 ENCOUNTER — Ambulatory Visit (INDEPENDENT_AMBULATORY_CARE_PROVIDER_SITE_OTHER): Payer: Medicare HMO | Admitting: Physical Medicine and Rehabilitation

## 2017-03-05 VITALS — BP 124/88 | HR 76 | Temp 98.0°F

## 2017-03-05 DIAGNOSIS — M5412 Radiculopathy, cervical region: Secondary | ICD-10-CM

## 2017-03-05 MED ORDER — BUPIVACAINE HCL 0.25 % IJ SOLN
2.0000 mL | Freq: Once | INTRAMUSCULAR | Status: AC
  Administered 2017-03-05: 2 mL

## 2017-03-05 NOTE — Progress Notes (Deleted)
Pt states a sharp pressing pain, mostly on the right side. Pt states pain has been going on since 1999, has been getting worse in the past 6 months. Pt states he is on restrictions per Dr. Otelia SergeantNitka no activities, medication as needed makes pain slightly better. +Driver, -BT, -Dye Allergies.

## 2017-03-05 NOTE — Patient Instructions (Signed)

## 2017-03-12 ENCOUNTER — Ambulatory Visit (INDEPENDENT_AMBULATORY_CARE_PROVIDER_SITE_OTHER): Payer: Medicare HMO | Admitting: Surgery

## 2017-03-19 ENCOUNTER — Ambulatory Visit (INDEPENDENT_AMBULATORY_CARE_PROVIDER_SITE_OTHER): Payer: Medicare HMO | Admitting: Surgery

## 2017-03-19 DIAGNOSIS — M5412 Radiculopathy, cervical region: Secondary | ICD-10-CM | POA: Diagnosis not present

## 2017-03-19 DIAGNOSIS — G5603 Carpal tunnel syndrome, bilateral upper limbs: Secondary | ICD-10-CM

## 2017-03-19 NOTE — Progress Notes (Signed)
Office Visit Note   Patient: Matthew CrazeJohn Weaver           Date of Birth: 1965/02/22           MRN: [id number] Visit Date: 03/19/2017              Requested by: Georgina QuintSagardia, Miguel Jose, MD [address] [place], [place] [address] PCP: Georgina QuintSagardia, Miguel Jose, MD   Assessment & Plan: Visit Diagnoses:  1. Radiculopathy, cervical region   2. Carpal tunnel syndrome, bilateral     Plan: Patient advised that best treatment option at this point would be C5-6 and C6-7 ACDF.  Surgical procedure discussed in detail with Dr. Otelia SergeantNitka today and myself.  Patient did have excellent diagnostic relief for a few hours with ESI.  He will proceed with bilateral wrist MRI scans and Dr. Otelia SergeantNitka will go over the results after those have been completed.  All questions answered.  Follow-Up Instructions: Return in about 2 months (around 05/17/2017).   Orders:  No orders of the defined types were placed in this encounter.  No orders of the defined types were placed in this encounter.     Procedures: No procedures performed   Clinical Data: No additional findings.   Subjective: No chief complaint on file.   HPI Patient returns after having C5 and C6 selective nerve root blocks by Dr. Alvester MorinNewton.  Patient states that he had excellent relief of his right-sided neck pain and right upper extremity radiculopathy for a few hours after the injection.  He continued to have left shoulder and scapular pain.  States that he is ready to proceed with cervical fusion as previously discussed with Dr. Ferdie PingNecko. Review of Systems No current cardiac pulmonary GI GU issues  Objective: Vital Signs: There were no vitals taken for this visit.  Physical Exam  Constitutional: He is oriented to person, place, and time. He appears well-developed. No distress.  HENT:  Head: Atraumatic.  Eyes: EOM are normal. Pupils are equal, round, and reactive to light.  Neck:  He has right greater than left brachial plexus trapezius and scapular  tenderness.  Pulmonary/Chest: No respiratory distress.  Abdominal: He exhibits no distension.  Musculoskeletal:  Gait is normal.  Bilateral shoulder exam unremarkable.  Neurological: He is alert and oriented to person, place, and time.  Skin: Skin is warm and dry.    Ortho Exam  Specialty Comments:  No specialty comments available.  Imaging: No results found.   PMFS History: Patient Active Problem List   Diagnosis Date Noted  . Cervicalgia 02/09/2017  . Spinal stenosis of lumbar region with neurogenic claudication 02/09/2017   Past Medical History:  Diagnosis Date  . Allergy   . Anxiety   . Cataract   . DDD (degenerative disc disease), lumbar   . Glaucoma   . Spinal stenosis     Family History  Problem Relation Age of Onset  . Cancer Mother   . Diabetes Father   . Hyperlipidemia Father   . Hypertension Father   . Stroke Father   . Heart disease Father        per patient AFib    Past Surgical History:  Procedure Laterality Date  . EYE SURGERY    . KNEE SURGERY    . SPINAL FUSION     Social History   Occupational History  . Not on file  Tobacco Use  . Smoking status: Former Smoker    Last attempt to quit: 05/30/1996    Years since quitting:  20.8  . Smokeless tobacco: Never Used  Substance and Sexual Activity  . Alcohol use: No  . Drug use: No  . Sexual activity: No

## 2017-03-23 NOTE — Progress Notes (Signed)
Matthew Weaver - 52 y.o. male MRN [id number]  Date of birth: December 17, 1965  Office Visit Note: Visit Date: 03/05/2017 PCP: Georgina Quint, MD Referred by: Georgina Quint, *  Subjective: Chief Complaint  Patient presents with  . Neck - Pain   HPI: Mr. Charters is a 52 year old right-hand-dominant gentleman who comes in today at the request of Dr. Otelia Sergeant for diagnostic right C5 and C6 selective nerve root block.  The patient is having right radicular type symptoms.  MRI was reviewed with the patient.    ROS Otherwise per HPI.  Assessment & Plan: Visit Diagnoses:  1. Cervical radiculopathy     Plan: Findings:  Patient did seem to get some relief initially with the anesthetic phase of the injection.  He is going to monitor this the rest of the day.    Meds & Orders:  Meds ordered this encounter  Medications  . bupivacaine (MARCAINE) 0.25 % (with pres) injection 2 mL    Orders Placed This Encounter  Procedures  . XR C-ARM NO REPORT  . Epidural Steroid injection    Follow-up: Return for Dr. Otelia Sergeant.   Procedures: No procedures performed  Cervical Transforaminal Selective Nerve Root Block with Fluoroscopic Guidance   Patient: Balthazar Dooly      Date of Birth: 04/29/1965 MRN: [id number] PCP: Georgina Quint, MD      Visit Date: 03/05/2017   Universal Protocol:    Date/Time: 02/25/195:12 AM  Consent Given By: the patient  Position: lateral  Additional Comments: Vital signs were monitored before and after the procedure. Patient was prepped and draped in the usual sterile fashion. The correct patient, procedure, and site was verified.   Injection Procedure Details:  Procedure Site One Meds Administered:  Meds ordered this encounter  Medications  . bupivacaine (MARCAINE) 0.25 % (with pres) injection 2 mL     Laterality: Right  Location/Site:  C5-6 C6-7  Needle size: 25 duage  Needle type: standard  Needle Placement:  foramen  Findings:  -Contrast Used: 0.5 mL iohexol 180 mg iodine/mL   -Comments: Excellent flow of contrast along the nerve root at both foramen.  Procedure Details: The patient was positioned side-lying with the affected side upward.  The C arm was positioned with tilting to achieve a true lateral image and then positioned with more oblique rotation to achieve the most open image of the foramen possible.  The skin over the targeted area which was the posterior inferior portion of the foramen was then anesthetized with a small amount of 1% lidocaine without epinephrine.  A 25-gauge 1.5 in. needle was then introduced through the skin and down to the targeted area under biplanar fluoroscopic guidance.  Multiple AP and lateral/oblique views were monitored during needle manipulation.  Final placement was with the needle just outside of the 6 o'clock position on the AP view.  After negative aspirate for air, blood, and CSF, a 1 ml. volume of Isovue-250 was injected into the foramen and the flow of contrast was observed. Radiographs were obtained for documentation purposes.   When there was no arterial or venous flow of contrast, the injectate was administered into the level noted above.     Additional Comments:  The patient tolerated the procedure well Dressing: Band-Aid    Post-procedure details: Patient was observed during the procedure. Post-procedure instructions were reviewed.  Patient left the clinic in stable condition.      Clinical History: MRI CERVICAL SPINE WITHOUT CONTRAST  TECHNIQUE: Multiplanar, multisequence MR imaging  of the cervical spine was performed. No intravenous contrast was administered.  COMPARISON:  Cervical spine radiographs 12/17/2016.  FINDINGS: Alignment: Straightening and mild reversal of cervical lordosis. No spondylolisthesis.  Vertebrae: No marrow edema or evidence of acute osseous abnormality. Visualized bone marrow signal is within normal  limits.  Cord: Spinal cord signal is within normal limits at all visualized levels.  Posterior Fossa, vertebral arteries, paraspinal tissues: Cervicomedullary junction is within normal limits. Negative visualized posterior fossa. Preserved major vascular flow voids in the neck. Negative neck soft tissues; maximal left level 2 B lymph node (series 10, image 13 but other visible cervical nodes appear within normal limits.  Disc levels:  C2-C3:  Negative.  C3-C4: Minimal disc bulge and endplate spurring. Borderline to mild bilateral C4 foraminal stenosis.  C4-C5: Minimal disc bulge and endplate spurring. Mild bilateral C5 foraminal stenosis.  C5-C6: Mild circumferential disc bulge with leftward broad-based posterior component of disc. Mild ligament flavum hypertrophy. Spinal stenosis with no spinal cord mass effect (series 10, image 21). Left greater than right foraminal disc and/or endplate spurring. Mild to moderate left and borderline to mild right C6 foraminal stenosis.  C6-C7: Subtle right paracentral disc protrusion (series 10, image 26). No stenosis.  C7-T1:  Negative.  No upper thoracic spinal or foraminal stenosis identified.  IMPRESSION: 1. Generally mild for age cervical disc and endplate degeneration with no acute osseous abnormality identified. 2. Multifactorial mild spinal stenosis and up to moderate left foraminal stenosis at C5-C6. No spinal cord mass effect or signal abnormality. 3. Up to mild disc and endplate related foraminal stenosis also at the bilateral C4 and C5 levels.   Electronically Signed   By: Odessa Fleming M.D.   On: 01/11/2017 14:40  MRI LUMBAR SPINE WITHOUT AND WITH CONTRAST  TECHNIQUE: Multiplanar and multiecho pulse sequences of the lumbar spine were obtained without and with intravenous contrast.  CONTRAST: 20mL MULTIHANCE GADOBENATE DIMEGLUMINE 529 MG/ML IV SOLN  COMPARISON: Radiography  08/13/2016  FINDINGS: Segmentation: Standard.  Alignment: Physiologic.  Vertebrae: No fracture, evidence of discitis, or bone lesion. Very mild degenerative type edema and enhancement about the left aspect of the L5-S1 disc.  Conus medullaris: Extends to the L1 level and appears normal.  Paraspinal and other soft tissues: Small incidental left renal cyst. Postoperative intrinsic back muscle atrophy L3-4 to L5-S1.  Disc levels:  T12- L1: Mild disc narrowing and ventral endplate ridging. Small chronic Schmorl's node in the T12 inferior endplate. No impingement  L1-L2: Degenerative disc narrowing and desiccation with ventral endplate spurs. No herniation or impingement  L2-L3: Unremarkable.  L3-L4: Posterior arthrodesis changes with artifact from crossing screws. The foramina are visible and patent. No evidence of canal impingement. No visible herniation. Posterior enhancing annular fissure.  L4-L5: Mild disc narrowing and desiccation with shallow central protrusion at an annular fissure, crowding the bilateral descending L5 nerve roots. Facet arthropathy with mild spurring. Patent foramina  L5-S1:Disc degeneration with asymmetric leftward narrowing and far-lateral endplate spurring. Small annular fissure. Asymmetric, fairly bulky left degenerative facet arthropathy. Left foraminal narrowing is mild to moderate. Patent spinal canal.  IMPRESSION: 1. L3-4 posterior arthrodesis changes. There is related image distortion without evidence of impingement. A large posterior annular fissure is present this level. 2. L4-5 shallow disc protrusion with annular fissure that contacts and could irritate the bilateral descending L5 nerve roots. 3. L5-S1 asymmetric leftward disc narrowing and spurring with mild degenerative type edema and enhancement in close contact to the traversing far-lateral L5 nerve root. There  is mild-to-moderate left foraminal narrowing at this  level.   Electronically Signed By: Marnee SpringJonathon Watts M.D. On: 09/13/2016 13:54  He reports that he quit smoking about 20 years ago. he has never used smokeless tobacco.  Recent Labs    05/30/16 1050 02/06/17 1151  HGBA1C 5.4 5.5    Objective:  VS:  HT:    WT:   BMI:     BP:124/88  HR:76bpm  TEMP:98 F (36.7 C)(Oral)  RESP:95 % Physical Exam  Musculoskeletal:     On manual muscle testing there is 5/5 strength in all muscle groups of the upper extremities bilaterally without deficits. There is a negative Hoffmann's test bilaterally.      Ortho Exam Imaging: No results found.  Past Medical/Family/Surgical/Social History: Medications & Allergies reviewed per EMR Patient Active Problem List   Diagnosis Date Noted  . Cervicalgia 02/09/2017  . Spinal stenosis of lumbar region with neurogenic claudication 02/09/2017   Past Medical History:  Diagnosis Date  . Allergy   . Anxiety   . Cataract   . DDD (degenerative disc disease), lumbar   . Glaucoma   . Spinal stenosis    Family History  Problem Relation Age of Onset  . Cancer Mother   . Diabetes Father   . Hyperlipidemia Father   . Hypertension Father   . Stroke Father   . Heart disease Father        per patient AFib   Past Surgical History:  Procedure Laterality Date  . EYE SURGERY    . KNEE SURGERY    . SPINAL FUSION     Social History   Occupational History  . Not on file  Tobacco Use  . Smoking status: Former Smoker    Last attempt to quit: 05/30/1996    Years since quitting: 20.8  . Smokeless tobacco: Never Used  Substance and Sexual Activity  . Alcohol use: No  . Drug use: No  . Sexual activity: No

## 2017-03-23 NOTE — Procedures (Signed)
Cervical Transforaminal Selective Nerve Root Block with Fluoroscopic Guidance   Patient: Matthew CrazeJohn Seelman      Date of Birth: 05/10/1965 MRN: [id number] PCP: Georgina QuintSagardia, Miguel Jose, MD      Visit Date: 03/05/2017   Universal Protocol:    Date/Time: 02/25/195:12 AM  Consent Given By: the patient  Position: lateral  Additional Comments: Vital signs were monitored before and after the procedure. Patient was prepped and draped in the usual sterile fashion. The correct patient, procedure, and site was verified.   Injection Procedure Details:  Procedure Site One Meds Administered:  Meds ordered this encounter  Medications  . bupivacaine (MARCAINE) 0.25 % (with pres) injection 2 mL     Laterality: Right  Location/Site:  C5-6 C6-7  Needle size: 25 duage  Needle type: standard  Needle Placement: foramen  Findings:  -Contrast Used: 0.5 mL iohexol 180 mg iodine/mL   -Comments: Excellent flow of contrast along the nerve root at both foramen.  Procedure Details: The patient was positioned side-lying with the affected side upward.  The C arm was positioned with tilting to achieve a true lateral image and then positioned with more oblique rotation to achieve the most open image of the foramen possible.  The skin over the targeted area which was the posterior inferior portion of the foramen was then anesthetized with a small amount of 1% lidocaine without epinephrine.  A 25-gauge 1.5 in. needle was then introduced through the skin and down to the targeted area under biplanar fluoroscopic guidance.  Multiple AP and lateral/oblique views were monitored during needle manipulation.  Final placement was with the needle just outside of the 6 o'clock position on the AP view.  After negative aspirate for air, blood, and CSF, a 1 ml. volume of Isovue-250 was injected into the foramen and the flow of contrast was observed. Radiographs were obtained for documentation purposes.   When there was no  arterial or venous flow of contrast, the injectate was administered into the level noted above.     Additional Comments:  The patient tolerated the procedure well Dressing: Band-Aid    Post-procedure details: Patient was observed during the procedure. Post-procedure instructions were reviewed.  Patient left the clinic in stable condition.

## 2017-03-26 ENCOUNTER — Telehealth (INDEPENDENT_AMBULATORY_CARE_PROVIDER_SITE_OTHER): Payer: Self-pay

## 2017-03-26 NOTE — Telephone Encounter (Signed)
I called patient to discuss scheduling cervical fusion

## 2017-04-06 ENCOUNTER — Encounter (INDEPENDENT_AMBULATORY_CARE_PROVIDER_SITE_OTHER): Payer: Self-pay | Admitting: Specialist

## 2017-04-15 ENCOUNTER — Telehealth (INDEPENDENT_AMBULATORY_CARE_PROVIDER_SITE_OTHER): Payer: Self-pay | Admitting: Specialist

## 2017-04-15 NOTE — Telephone Encounter (Signed)
Patient called stating that he is putting off his MRI for his hands until the end of May.  He also wanted to know what levels of fusion is going to have (I am not sur if this is what he was talking about), he wants to know because his aunt is going to be staying with him while recovers and she is an Char[employer] fundraiserN.  CB#[phone] or his aunt's [phone].  Thank you.

## 2017-04-16 NOTE — Telephone Encounter (Signed)
Patient wants to put off MRI's of hands till end of May. -------Is this ok?   --- I tried to call patient back, # has been disconnected or changed.  There is NO DPR signed that I can call his Aunt.  I will keep trying to reach the patient.  He is having surgery at C5-6, C6-7 levels.

## 2017-05-14 ENCOUNTER — Ambulatory Visit (INDEPENDENT_AMBULATORY_CARE_PROVIDER_SITE_OTHER): Payer: Medicare HMO | Admitting: Surgery

## 2017-10-14 IMAGING — MR MR LUMBAR SPINE WO/W CM
8 series · 48 of 48 positions shown · IV contrast (20ml multihance)
Comparison: Radiography 08/13/2016

CLINICAL DATA: Increasing back pain and weakness. Spinal fusion in
[address] in 1990s.

EXAM:
MRI LUMBAR SPINE WITHOUT AND WITH CONTRAST
TECHNIQUE: Multiplanar and multiecho pulse sequences of the lumbar spine were
obtained without and with intravenous contrast.
CONTRAST:  20mL MULTIHANCE GADOBENATE DIMEGLUMINE 529 MG/ML IV SOLN

[Series 4: tirm sag · sagittal · 4.0mm · 0.55mm/px · 3 of 12 slices shown]
[im 1/12]
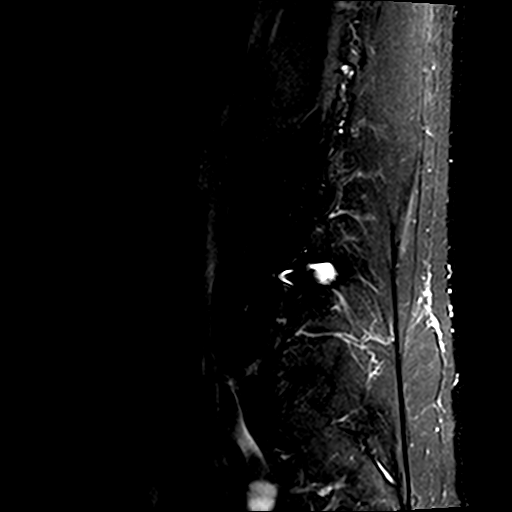
[im 6/12]
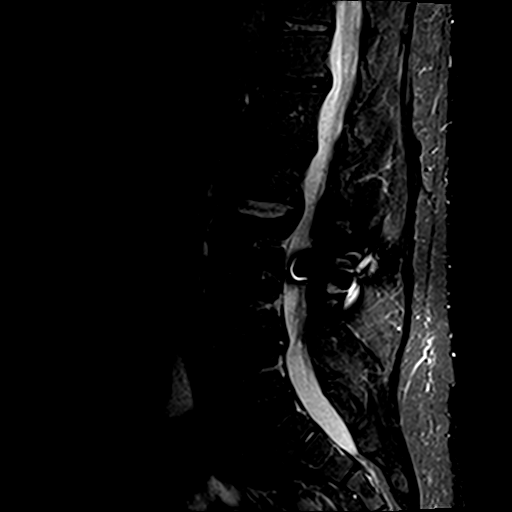
[im 12/12]
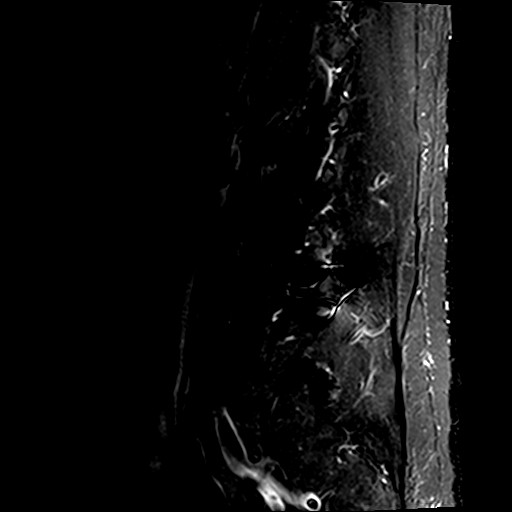

[Series 5: T1 · sagittal · 4.0mm · 0.88mm/px · 3 of 12 slices shown (1 of 2)]
[im 1/12]
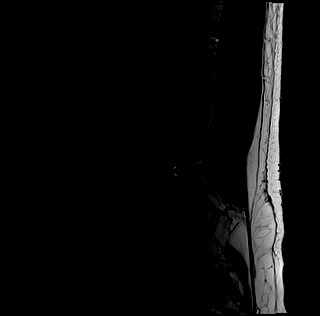
[im 6/12]
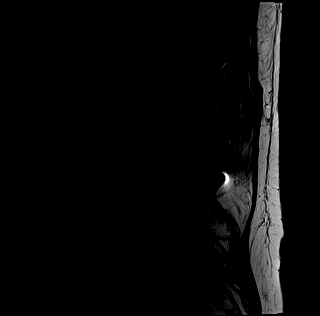
[im 12/12]
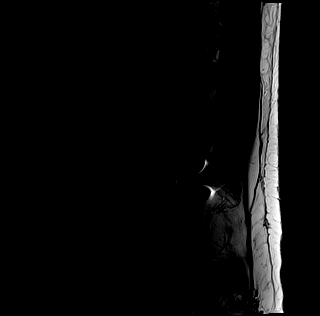

[Series 6: T1 · axial · 4.0mm · 0.78mm/px · z∈[-159,+56]mm · 11 of 39 slices shown (2 of 2)]
[im 1/39]
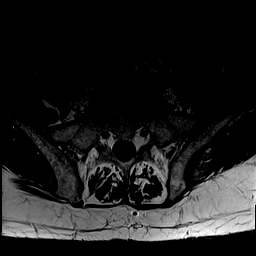
[im 4/39]
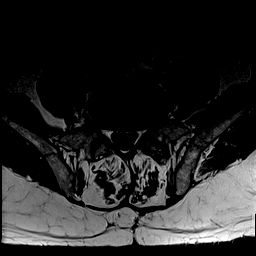
[im 8/39]
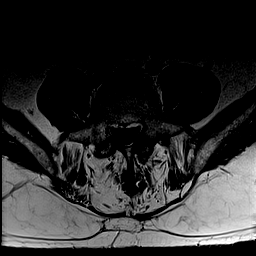
[im 12/39]
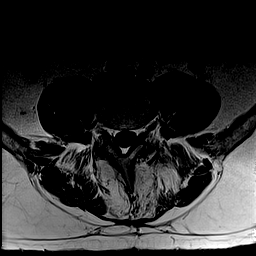
[im 16/39]
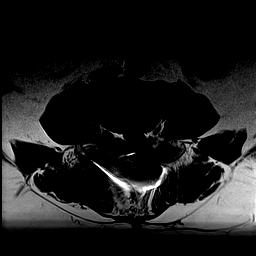
[im 20/39]
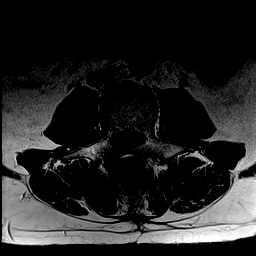
[im 23/39]
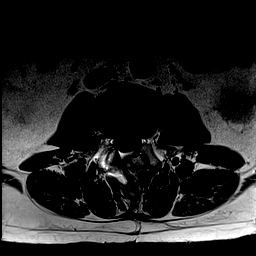
[im 27/39]
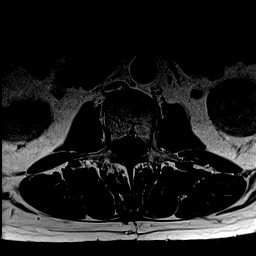
[im 31/39]
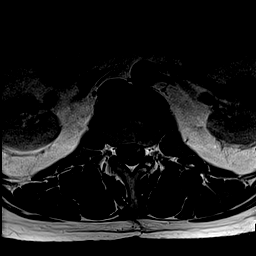
[im 35/39]
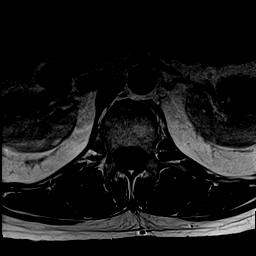
[im 39/39]
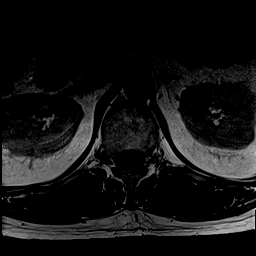

[Series 7: T2 · axial · 4.0mm · 0.78mm/px · z∈[-159,+56]mm · 11 of 39 slices shown (1 of 2)]
[im 1/39]
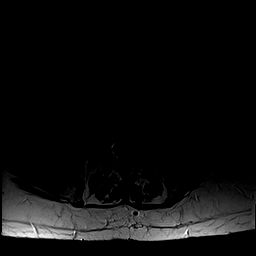
[im 4/39]
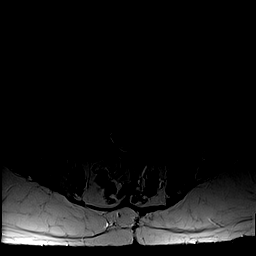
[im 8/39]
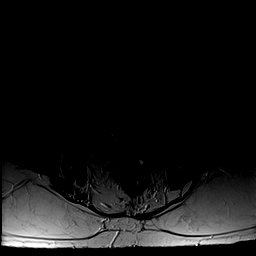
[im 12/39]
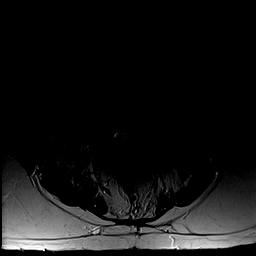
[im 16/39]
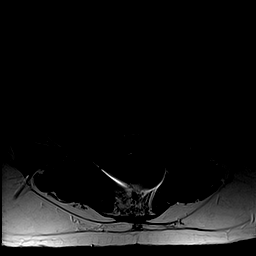
[im 20/39]
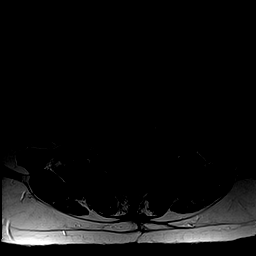
[im 23/39]
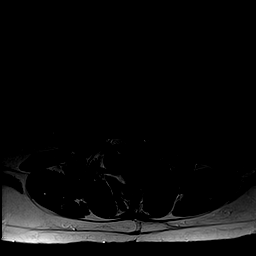
[im 27/39]
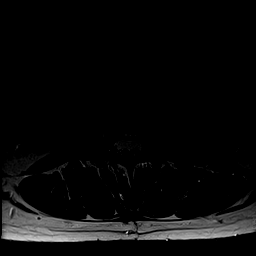
[im 31/39]
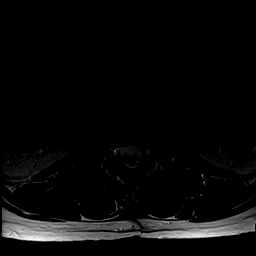
[im 35/39]
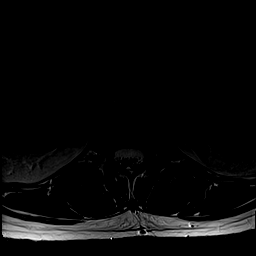
[im 39/39]
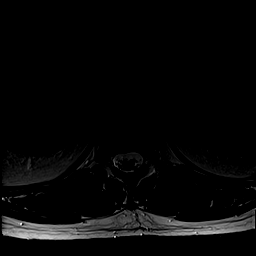

[Series 8: T2 · sagittal · 4.0mm · 0.88mm/px · 3 of 12 slices shown (2 of 2)]
[im 1/12]
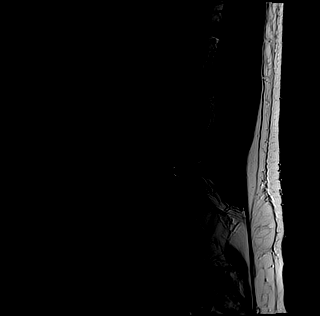
[im 6/12]
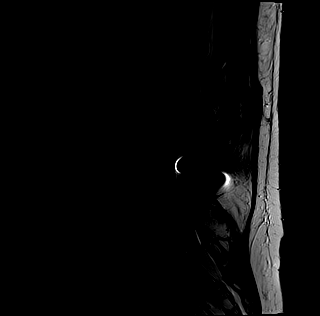
[im 12/12]
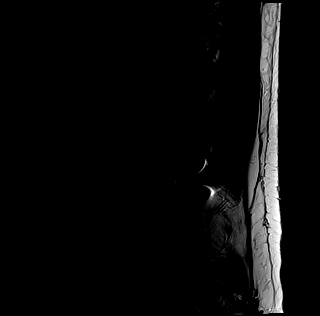

[Series 9: T1 fat-sat · sagittal · 4.0mm · 0.88mm/px · 3 of 12 slices shown]
[im 1/12]
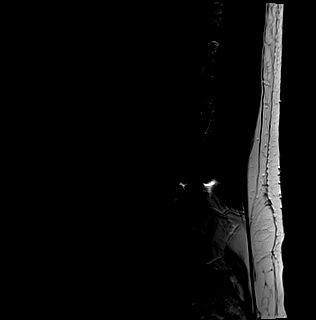
[im 6/12]
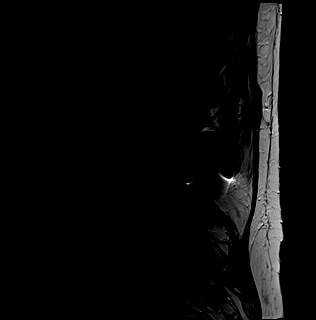
[im 12/12]
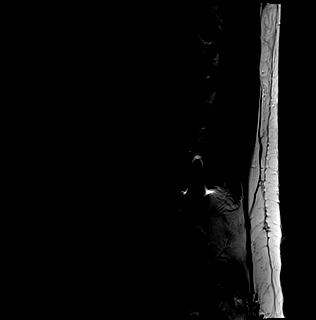

[Series 10: T1 fat-sat post-contrast · sagittal · 4.0mm · 0.88mm/px · 3 of 12 slices shown]
[im 1/12]
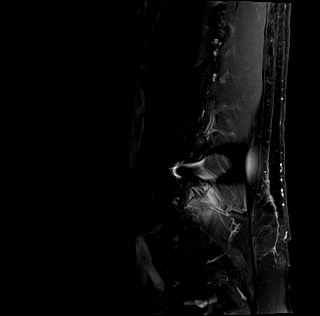
[im 6/12]
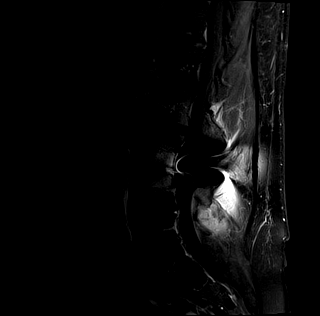
[im 12/12]
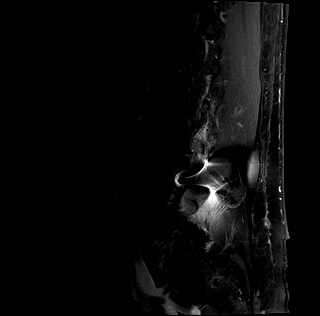

[Series 11: T1 post-contrast · axial · 4.0mm · 0.78mm/px · z∈[-159,+56]mm · 11 of 39 slices shown]
[im 1/39]
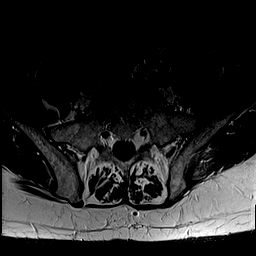
[im 4/39]
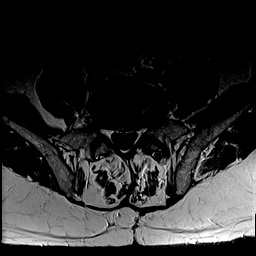
[im 8/39]
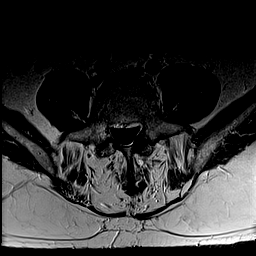
[im 12/39]
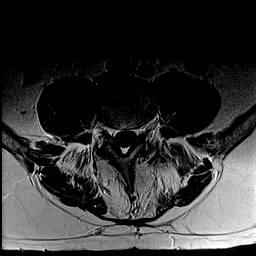
[im 16/39]
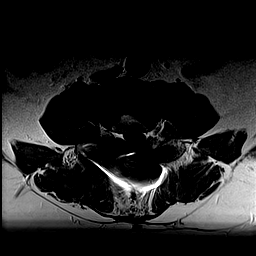
[im 20/39]
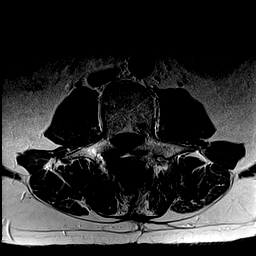
[im 23/39]
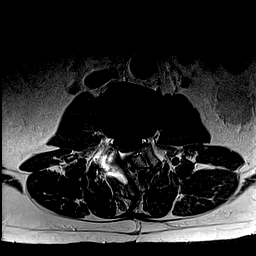
[im 27/39]
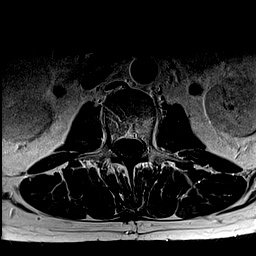
[im 31/39]
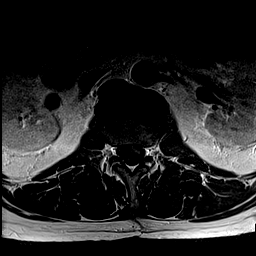
[im 35/39]
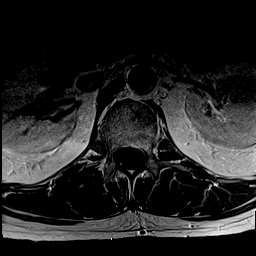
[im 39/39]
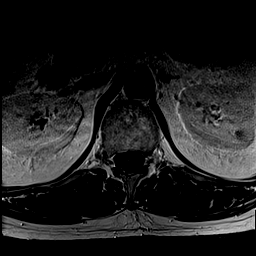

[48 of 48 positions shown; findings below may reference images not displayed]

FINDINGS: Segmentation:  Standard.

Alignment:  Physiologic.

Vertebrae: No fracture, evidence of discitis, or bone lesion. Very
mild degenerative type edema and enhancement about the left aspect
of the L5-S1 disc.

Conus medullaris: Extends to the L1 level and appears normal.

Paraspinal and other soft tissues: Small incidental left renal cyst.
Postoperative intrinsic back muscle atrophy L3-4 to L5-S1.

Disc levels:

T12- L1: Mild disc narrowing and ventral endplate ridging. Small
chronic Schmorl's node in the T12 inferior endplate. No impingement

L1-L2: Degenerative disc narrowing and desiccation with ventral
endplate spurs. No herniation or impingement

L2-L3: Unremarkable.

L3-L4: Posterior arthrodesis changes with artifact from crossing
screws. The foramina are visible and patent. No evidence of canal
impingement. No visible herniation. Posterior enhancing annular
fissure.

L4-L5: Mild disc narrowing and desiccation with shallow central
protrusion at an annular fissure, crowding the bilateral descending
L5 nerve roots. Facet arthropathy with mild spurring. Patent
foramina

L5-S1:Disc degeneration with asymmetric leftward narrowing and
far-lateral endplate spurring. Small annular fissure. Asymmetric,
fairly bulky left degenerative facet arthropathy. Left foraminal
narrowing is mild to moderate. Patent spinal canal.
IMPRESSION: 1. L3-4 posterior arthrodesis changes. There is related image
distortion without evidence of impingement. A large posterior
annular fissure is present this level.
2. L4-5 shallow disc protrusion with annular fissure that contacts
and could irritate the bilateral descending L5 nerve roots.
3. L5-S1 asymmetric leftward disc narrowing and spurring with mild
degenerative type edema and enhancement in close contact to the
traversing far-lateral L5 nerve root. There is mild-to-moderate left
foraminal narrowing at this level.

## 2019-10-03 ENCOUNTER — Ambulatory Visit: Payer: Self-pay | Admitting: Specialist
